# Patient Record
Sex: Male | Born: 1960 | Race: White | Hispanic: No | Marital: Married | Smoking: Former smoker
Health system: Southern US, Community
[De-identification: ages and names within clinical notes are randomized; demographics above are authoritative.]

## PROBLEM LIST (undated history)

## (undated) DIAGNOSIS — C7A8 Other malignant neuroendocrine tumors: Secondary | ICD-10-CM

## (undated) DIAGNOSIS — N289 Disorder of kidney and ureter, unspecified: Secondary | ICD-10-CM

## (undated) DIAGNOSIS — C7951 Secondary malignant neoplasm of bone: Principal | ICD-10-CM

## (undated) DIAGNOSIS — IMO0001 Reserved for inherently not codable concepts without codable children: Secondary | ICD-10-CM

## (undated) DIAGNOSIS — C719 Malignant neoplasm of brain, unspecified: Secondary | ICD-10-CM

## (undated) DIAGNOSIS — I2699 Other pulmonary embolism without acute cor pulmonale: Secondary | ICD-10-CM

## (undated) DIAGNOSIS — I519 Heart disease, unspecified: Secondary | ICD-10-CM

## (undated) DIAGNOSIS — C3490 Malignant neoplasm of unspecified part of unspecified bronchus or lung: Secondary | ICD-10-CM

## (undated) HISTORY — DX: Secondary malignant neoplasm of bone: C79.51

## (undated) HISTORY — DX: Other pulmonary embolism without acute cor pulmonale: I26.99

## (undated) HISTORY — PX: OTHER SURGICAL HISTORY: SHX169

## (undated) HISTORY — DX: Heart disease, unspecified: I51.9

## (undated) HISTORY — DX: Other malignant neuroendocrine tumors: C7A.8

## (undated) HISTORY — PX: LUNG BIOPSY: SHX232

## (undated) HISTORY — DX: Reserved for inherently not codable concepts without codable children: IMO0001

---

## 2001-01-16 ENCOUNTER — Emergency Department (HOSPITAL_COMMUNITY): Admission: EM | Admit: 2001-01-16 | Discharge: 2001-01-16 | Payer: Self-pay | Admitting: Emergency Medicine

## 2002-02-22 ENCOUNTER — Encounter: Payer: Self-pay | Admitting: Emergency Medicine

## 2002-02-22 ENCOUNTER — Emergency Department (HOSPITAL_COMMUNITY): Admission: EM | Admit: 2002-02-22 | Discharge: 2002-02-22 | Payer: Self-pay | Admitting: Emergency Medicine

## 2004-03-23 ENCOUNTER — Emergency Department (HOSPITAL_COMMUNITY): Admission: EM | Admit: 2004-03-23 | Discharge: 2004-03-23 | Payer: Self-pay | Admitting: Emergency Medicine

## 2006-03-01 ENCOUNTER — Emergency Department (HOSPITAL_COMMUNITY): Admission: EM | Admit: 2006-03-01 | Discharge: 2006-03-01 | Payer: Self-pay | Admitting: Emergency Medicine

## 2008-09-03 HISTORY — PX: KIDNEY SURGERY: SHX687

## 2009-06-09 ENCOUNTER — Inpatient Hospital Stay (HOSPITAL_COMMUNITY): Admission: RE | Admit: 2009-06-09 | Discharge: 2009-06-10 | Payer: Self-pay | Admitting: Family Medicine

## 2009-09-03 HISTORY — PX: CARDIAC CATHETERIZATION: SHX172

## 2010-01-20 ENCOUNTER — Observation Stay (HOSPITAL_COMMUNITY): Admission: AD | Admit: 2010-01-20 | Discharge: 2010-01-21 | Payer: Self-pay | Admitting: Cardiovascular Disease

## 2010-11-20 LAB — CBC
HCT: 42.8 % (ref 39.0–52.0)
Hemoglobin: 15.4 g/dL (ref 13.0–17.0)
MCV: 91.1 fL (ref 78.0–100.0)
Platelets: 200 10*3/uL (ref 150–400)
RBC: 4.7 MIL/uL (ref 4.22–5.81)
RDW: 13.9 % (ref 11.5–15.5)
RDW: 14.4 % (ref 11.5–15.5)

## 2010-11-20 LAB — DIFFERENTIAL
Eosinophils Relative: 2 % (ref 0–5)
Lymphocytes Relative: 23 % (ref 12–46)
Lymphs Abs: 1.8 10*3/uL (ref 0.7–4.0)
Monocytes Absolute: 0.3 10*3/uL (ref 0.1–1.0)
Monocytes Relative: 4 % (ref 3–12)
Neutrophils Relative %: 71 % (ref 43–77)

## 2010-11-20 LAB — BASIC METABOLIC PANEL
BUN: 13 mg/dL (ref 6–23)
BUN: 8 mg/dL (ref 6–23)
CO2: 22 mEq/L (ref 19–32)
Calcium: 8.7 mg/dL (ref 8.4–10.5)
Chloride: 107 mEq/L (ref 96–112)
Creatinine, Ser: 0.98 mg/dL (ref 0.4–1.5)
Creatinine, Ser: 0.79 mg/dL (ref 0.4–1.5)
GFR calc Af Amer: >60 mL/min (ref >60)
Glucose, Bld: 100 mg/dL — ABNORMAL HIGH (ref 70–99)
Glucose, Bld: 108 mg/dL — ABNORMAL HIGH (ref 70–99)
Potassium: 3.3 mEq/L — ABNORMAL LOW (ref 3.5–5.1)
Sodium: 137 mEq/L (ref 135–145)

## 2010-11-20 LAB — LIPID PANEL
Cholesterol: 171 mg/dL (ref 0–200)
HDL: 22 mg/dL — ABNORMAL LOW (ref >39)
LDL Cholesterol: 122 mg/dL — ABNORMAL HIGH (ref 0–99)
Total CHOL/HDL Ratio: 7.8 RATIO

## 2010-11-20 LAB — POCT CARDIAC MARKERS: Troponin i, poc: <0.05 ng/mL (ref 0.00–0.09)

## 2010-11-20 LAB — PROTIME-INR: Prothrombin Time: 14.1 seconds (ref 11.6–15.2)

## 2010-11-20 LAB — CARDIAC PANEL(CRET KIN+CKTOT+MB+TROPI)
Total CK: 64 U/L (ref 7–232)
Troponin I: <0.01 ng/mL (ref 0.00–0.06)

## 2010-11-20 LAB — TSH: TSH: 1.595 u[IU]/mL (ref 0.350–4.500)

## 2010-12-07 LAB — COMPREHENSIVE METABOLIC PANEL
ALT: 17 U/L (ref 0–53)
AST: 15 U/L (ref 0–37)
Albumin: 4 g/dL (ref 3.5–5.2)
BUN: 14 mg/dL (ref 6–23)
CO2: 28 mEq/L (ref 19–32)
Calcium: 9.7 mg/dL (ref 8.4–10.5)
Creatinine, Ser: 1.16 mg/dL (ref 0.4–1.5)
GFR calc Af Amer: >60 mL/min (ref >60)
Glucose, Bld: 92 mg/dL (ref 70–99)

## 2010-12-07 LAB — PROTIME-INR
INR: 1.1 (ref 0.00–1.49)
Prothrombin Time: 14.1 seconds (ref 11.6–15.2)

## 2010-12-07 LAB — URINE CULTURE: Colony Count: NO GROWTH

## 2010-12-07 LAB — DIFFERENTIAL
Basophils Absolute: 0 10*3/uL (ref 0.0–0.1)
Neutro Abs: 11.2 10*3/uL — ABNORMAL HIGH (ref 1.7–7.7)
Neutrophils Relative %: 81 % — ABNORMAL HIGH (ref 43–77)

## 2010-12-07 LAB — CBC
HCT: 44.8 % (ref 39.0–52.0)
Hemoglobin: 15.4 g/dL (ref 13.0–17.0)
RBC: 4.98 MIL/uL (ref 4.22–5.81)
WBC: 13.8 10*3/uL — ABNORMAL HIGH (ref 4.0–10.5)

## 2010-12-07 LAB — URINALYSIS, ROUTINE W REFLEX MICROSCOPIC
Bilirubin Urine: NEGATIVE
Leukocytes, UA: NEGATIVE
Protein, ur: 100 mg/dL — AB
Specific Gravity, Urine: 1.025 (ref 1.005–1.030)
Urobilinogen, UA: 0.2 mg/dL (ref 0.0–1.0)

## 2011-06-30 ENCOUNTER — Emergency Department (HOSPITAL_COMMUNITY)
Admission: EM | Admit: 2011-06-30 | Discharge: 2011-06-30 | Disposition: A | Discharge status: Other Acute Inpatient Hospital | Payer: 59 | Source: Home / Self Care | Attending: Emergency Medicine | Admitting: Emergency Medicine

## 2011-06-30 ENCOUNTER — Inpatient Hospital Stay (HOSPITAL_COMMUNITY): Payer: 59

## 2011-06-30 ENCOUNTER — Inpatient Hospital Stay (HOSPITAL_COMMUNITY)
Admission: AD | Admit: 2011-06-30 | Discharge: 2011-07-02 | DRG: 542 | Disposition: A | Discharge status: Home / Self Care | Payer: 59 | Source: Other Acute Inpatient Hospital | Attending: Internal Medicine | Admitting: Internal Medicine

## 2011-06-30 ENCOUNTER — Emergency Department (HOSPITAL_COMMUNITY): Payer: 59

## 2011-06-30 DIAGNOSIS — C787 Secondary malignant neoplasm of liver and intrahepatic bile duct: Secondary | ICD-10-CM | POA: Diagnosis present

## 2011-06-30 DIAGNOSIS — R51 Headache: Secondary | ICD-10-CM | POA: Insufficient documentation

## 2011-06-30 DIAGNOSIS — C7800 Secondary malignant neoplasm of unspecified lung: Secondary | ICD-10-CM | POA: Diagnosis present

## 2011-06-30 DIAGNOSIS — T380X5A Adverse effect of glucocorticoids and synthetic analogues, initial encounter: Secondary | ICD-10-CM | POA: Diagnosis not present

## 2011-06-30 DIAGNOSIS — C7951 Secondary malignant neoplasm of bone: Principal | ICD-10-CM | POA: Diagnosis present

## 2011-06-30 DIAGNOSIS — C7952 Secondary malignant neoplasm of bone marrow: Principal | ICD-10-CM | POA: Diagnosis present

## 2011-06-30 DIAGNOSIS — G936 Cerebral edema: Secondary | ICD-10-CM | POA: Diagnosis present

## 2011-06-30 DIAGNOSIS — H9209 Otalgia, unspecified ear: Secondary | ICD-10-CM | POA: Insufficient documentation

## 2011-06-30 DIAGNOSIS — C7931 Secondary malignant neoplasm of brain: Secondary | ICD-10-CM | POA: Diagnosis present

## 2011-06-30 DIAGNOSIS — C771 Secondary and unspecified malignant neoplasm of intrathoracic lymph nodes: Secondary | ICD-10-CM | POA: Diagnosis present

## 2011-06-30 DIAGNOSIS — C3430 Malignant neoplasm of lower lobe, unspecified bronchus or lung: Secondary | ICD-10-CM | POA: Diagnosis present

## 2011-06-30 DIAGNOSIS — F17200 Nicotine dependence, unspecified, uncomplicated: Secondary | ICD-10-CM | POA: Diagnosis present

## 2011-06-30 DIAGNOSIS — D72829 Elevated white blood cell count, unspecified: Secondary | ICD-10-CM | POA: Diagnosis not present

## 2011-06-30 DIAGNOSIS — C7970 Secondary malignant neoplasm of unspecified adrenal gland: Secondary | ICD-10-CM | POA: Diagnosis present

## 2011-06-30 DIAGNOSIS — C7949 Secondary malignant neoplasm of other parts of nervous system: Secondary | ICD-10-CM | POA: Diagnosis present

## 2011-06-30 DIAGNOSIS — E785 Hyperlipidemia, unspecified: Secondary | ICD-10-CM | POA: Diagnosis present

## 2011-06-30 HISTORY — DX: Disorder of kidney and ureter, unspecified: N28.9

## 2011-06-30 LAB — COMPREHENSIVE METABOLIC PANEL
ALT: 18 U/L (ref 0–53)
Alkaline Phosphatase: 109 U/L (ref 39–117)
CO2: 23 mEq/L (ref 19–32)
Chloride: 97 mEq/L (ref 96–112)
GFR calc Af Amer: >90 mL/min (ref >90)
Glucose, Bld: 254 mg/dL — ABNORMAL HIGH (ref 70–99)
Potassium: 3.8 mEq/L (ref 3.5–5.1)
Sodium: 134 mEq/L — ABNORMAL LOW (ref 135–145)
Total Bilirubin: 0.4 mg/dL (ref 0.3–1.2)
Total Protein: 7.7 g/dL (ref 6.0–8.3)

## 2011-06-30 LAB — CBC
HCT: 48.9 % (ref 39.0–52.0)
Hemoglobin: 16.7 g/dL (ref 13.0–17.0)
RBC: 5.47 MIL/uL (ref 4.22–5.81)
WBC: 8.1 10*3/uL (ref 4.0–10.5)

## 2011-06-30 LAB — DIFFERENTIAL
Basophils Relative: 0 % (ref 0–1)
Lymphocytes Relative: 20 % (ref 12–46)
Lymphs Abs: 1.6 10*3/uL (ref 0.7–4.0)
Monocytes Absolute: 0.4 10*3/uL (ref 0.1–1.0)
Monocytes Relative: 5 % (ref 3–12)
Neutro Abs: 6 10*3/uL (ref 1.7–7.7)
Neutrophils Relative %: 74 % (ref 43–77)

## 2011-06-30 LAB — POCT I-STAT, CHEM 8
Creatinine, Ser: 0.8 mg/dL (ref 0.50–1.35)
TCO2: 24 mmol/L (ref 0–100)

## 2011-06-30 MED ORDER — MORPHINE SULFATE 4 MG/ML IJ SOLN
4.0000 mg | Freq: Once | INTRAMUSCULAR | Status: AC
  Administered 2011-06-30: 4 mg via INTRAVENOUS
  Filled 2011-06-30: qty 1

## 2011-06-30 MED ORDER — DEXAMETHASONE SODIUM PHOSPHATE 4 MG/ML IJ SOLN
10.0000 mg | Freq: Once | INTRAMUSCULAR | Status: AC
  Administered 2011-06-30: 14:00:00 via INTRAVENOUS
  Filled 2011-06-30: qty 3

## 2011-06-30 MED ORDER — IOHEXOL 300 MG/ML  SOLN
75.0000 mL | Freq: Once | INTRAMUSCULAR | Status: AC | PRN
  Administered 2011-06-30: 75 mL via INTRAVENOUS

## 2011-06-30 NOTE — ED Notes (Signed)
Report called to [facility] and ETA of 15 minutes

## 2011-06-30 NOTE — ED Notes (Signed)
Pt states he has had a knot on the right side of his head for about three months. States the pain is worse today

## 2011-06-30 NOTE — ED Provider Notes (Signed)
Scribed for [name], MD, the patient was seen in room APA01/APA01. This chart was scribed by [employer]. The patient's care started at 10:49  CSN: [id number] Arrival date & time: 06/30/2011 10:22 AM   First MD Initiated Contact with Patient 06/30/11 1049      Chief Complaint  Patient presents with  . Headache   HPI [name] is a 50 y.o. male who presents to the Emergency Department complaining of Headache. States he has had a knot on the right side of his head for three months. Reports that knot is associated with pain today. Patient is a mild smoker, non drinker and does not use illicit drugs. Patient ranks pain at 8/10 on NPS. Pain is non radiating at the site of the knot. Worse with palpation. Also complains of right ear pain. Reports partial relief with ibuprofen treatment.  History reviewed. No pertinent past medical history.  History reviewed. No pertinent past surgical history.  History reviewed. No pertinent family history.  History  Substance Use Topics  . Smoking status: Not on file  . Smokeless tobacco: Not on file  . Alcohol Use: Yes      Review of Systems  Constitutional: Negative.   HENT: Positive for ear pain.   Respiratory: Negative.   Cardiovascular: Negative.   Gastrointestinal: Negative.   Musculoskeletal: Negative.   Skin: Negative.   Neurological: Negative.   Hematological: Negative.   Psychiatric/Behavioral: Negative.   All other systems reviewed and are negative.    Allergies  Review of patient's allergies indicates no known allergies.  Home Medications   Current Outpatient Rx  Name Route Sig Dispense Refill  . ASPIRIN 81 MG PO TABS Oral Take 81 mg by mouth daily.      . IBUPROFEN 200 MG PO TABS Oral Take 600 mg by mouth every 6 (six) hours as needed. For pain       BP 124/73  Pulse 79  Temp(Src) 97 F (36.1 C) (Oral)  Resp 18  Ht 6\' 4"  (1.93 m)  Wt 190 lb (86.183 kg)  BMI 23.13 kg/m2  SpO2 99%  Physical Exam    Nursing note and vitals reviewed. Constitutional: He is oriented to person, place, and time. He appears well-developed and well-nourished.       Awake, alert, nontoxic appearance with baseline speech for patient.  HENT:  Head: Atraumatic.  Right Ear: Tympanic membrane and external ear normal.  Left Ear: Tympanic membrane and external ear normal.  Mouth/Throat: No oropharyngeal exudate.       Swollen area approximately the size of golf ball to the right temporal region. Tender, not warm or red.  Eyes: EOM are normal. Right eye exhibits no discharge. Left eye exhibits no discharge.  Neck: Neck supple.  Cardiovascular: Normal rate and regular rhythm.   No murmur heard. Pulmonary/Chest: Effort normal and breath sounds normal. No stridor. No respiratory distress. He has no wheezes. He has no rales. He exhibits no tenderness.  Abdominal: Soft. Bowel sounds are normal. There is no tenderness.  Musculoskeletal: He exhibits no tenderness.       Baseline ROM, moves extremities with no obvious new focal weakness.  Lymphadenopathy:    He has no cervical adenopathy.  Neurological: He is alert and oriented to person, place, and time. No cranial nerve deficit.       Awake, alert, cooperative and aware of situation; no facial asymmetry; tongue midline; major cranial nerves appear intact; no pronator drift, normal gait.  Skin: Skin is warm  and dry. No rash noted. No erythema.  Psychiatric: He has a normal mood and affect. His behavior is normal.    ED Course  Procedures   Labs Reviewed  POCT I-STAT, CHEM 8 - Abnormal; Notable for the following:    Glucose, Bld 107 (*)    Hemoglobin 18.0 (*)    HCT 53.0 (*)    All other components within normal limits  I-STAT, CHEM 8   No results found.   No diagnosis found. Patient advised as to the CT scan result in need for transfer DIAGNOSTIC STUDIES: Oxygen Saturation is 99% on room air, normal by my interpretation.   1:35 pm headache improved after  morphine patient declines further pain medicine. Remains alert Glasgow Coma Score 15 COORDINATION OF CARE: 11:06 - EDP examined patient at bedside. CT head ordered to view knot.   Spoke with Dr.Stinson, who accepts patient in transfer MDM  In light of vasogenic edema, it was decided to transfer to patient to [facility]. Decadron ordered.  CRITICAL CARE Performed by: [name]   Total critical care time: 30 minutes  Critical care time was exclusive of separately billable procedures and treating other patients.  Critical care was necessary to treat or prevent imminent or life-threatening deterioration.  Critical care was time spent personally by me on the following activities: development of treatment plan with patient and/or surrogate as well as nursing, discussions with consultants, evaluation of patient's response to treatment, examination of patient, obtaining history from patient or surrogate, ordering and performing treatments and interventions, ordering and review of laboratory studies, ordering and review of radiographic studies, pulse oximetry and re-evaluation of patient's condition.   Scribe Attestation I personally performed the services described in this documentation, which was scribed in my presence. The recorded information has been reviewed and considered.    [name], MD 06/30/11 1339

## 2011-07-01 ENCOUNTER — Inpatient Hospital Stay (HOSPITAL_COMMUNITY): Payer: 59

## 2011-07-01 DIAGNOSIS — C3490 Malignant neoplasm of unspecified part of unspecified bronchus or lung: Secondary | ICD-10-CM

## 2011-07-01 DIAGNOSIS — C7951 Secondary malignant neoplasm of bone: Secondary | ICD-10-CM

## 2011-07-01 DIAGNOSIS — C7952 Secondary malignant neoplasm of bone marrow: Secondary | ICD-10-CM

## 2011-07-01 DIAGNOSIS — C787 Secondary malignant neoplasm of liver and intrahepatic bile duct: Secondary | ICD-10-CM

## 2011-07-01 LAB — PROTIME-INR
INR: 1 (ref 0.00–1.49)
Prothrombin Time: 13.4 seconds (ref 11.6–15.2)

## 2011-07-01 LAB — DIFFERENTIAL
Eosinophils Absolute: 0 10*3/uL (ref 0.0–0.7)
Lymphs Abs: 1.6 10*3/uL (ref 0.7–4.0)
Monocytes Absolute: 0.7 10*3/uL (ref 0.1–1.0)
Monocytes Relative: 4 % (ref 3–12)
Neutro Abs: 13.2 10*3/uL — ABNORMAL HIGH (ref 1.7–7.7)
Neutrophils Relative %: 85 % — ABNORMAL HIGH (ref 43–77)

## 2011-07-01 LAB — CBC
Hemoglobin: 16.3 g/dL (ref 13.0–17.0)
MCH: 30.9 pg (ref 26.0–34.0)
MCHC: 35.7 g/dL (ref 30.0–36.0)
MCV: 86.5 fL (ref 78.0–100.0)
RBC: 5.27 MIL/uL (ref 4.22–5.81)

## 2011-07-01 LAB — COMPREHENSIVE METABOLIC PANEL
CO2: 23 mEq/L (ref 19–32)
Calcium: 10.2 mg/dL (ref 8.4–10.5)
Creatinine, Ser: 0.9 mg/dL (ref 0.50–1.35)
GFR calc Af Amer: >90 mL/min (ref >90)
GFR calc non Af Amer: >90 mL/min (ref >90)
Glucose, Bld: 134 mg/dL — ABNORMAL HIGH (ref 70–99)

## 2011-07-01 MED ORDER — GADOBENATE DIMEGLUMINE 529 MG/ML IV SOLN
20.0000 mL | Freq: Once | INTRAVENOUS | Status: AC | PRN
  Administered 2011-07-01: 20 mL via INTRAVENOUS

## 2011-07-01 MED ORDER — IOHEXOL 300 MG/ML  SOLN
100.0000 mL | Freq: Once | INTRAMUSCULAR | Status: AC | PRN
  Administered 2011-07-01: 100 mL via INTRAVENOUS

## 2011-07-02 ENCOUNTER — Inpatient Hospital Stay (HOSPITAL_COMMUNITY): Payer: 59

## 2011-07-02 ENCOUNTER — Encounter (HOSPITAL_COMMUNITY): Payer: 59

## 2011-07-02 MED ORDER — TECHNETIUM TC 99M MEDRONATE IV KIT
25.0000 | PACK | Freq: Once | INTRAVENOUS | Status: AC | PRN
Start: 2011-07-02 — End: 2011-07-02
  Administered 2011-07-02: 25 via INTRAVENOUS

## 2011-07-03 LAB — PROTEIN ELECTROPH W RFLX QUANT IMMUNOGLOBULINS
M-Spike, %: NOT DETECTED g/dL
Total Protein ELP: 7.3 g/dL (ref 6.0–8.3)

## 2011-07-03 NOTE — Consult Note (Signed)
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  [name] is a 50 year old man, 2-pack per day cigarette smoker until about 1 year ago when he decreased to 1/2 pack per day.  He has been in overall excellent health with no major medical or surgical illness.  He noticed a lump in the right temporal area of his scalp about 3 months ago.  He did not pay much attention to it until about a week ago when he started to have significant pain and headache.  He reported to the emergency department with these complaints where a CT scan of the head was done and shows a huge 6 x 6 cm lytic lesion in the frontoparietal bone extending intracranial with marked surrounding edema and mass effect on the brain.  There is a 12-mm midline shift.  A second lesion seen in the right parietal region, 3 x 1.7 cm.  No parenchymal brain masses identified on that study.  CT scan of the chest, abdomen, and pelvis were done.  I have personally reviewed all of the radiographic images.  There is a dominant right lower lobe lung mass 4 x 3.6 cm extending towards the hilum, an additional 2 x 1.8 cm satellite lesion in the right lower lobe and a 0.8- cm nodule in the left upper lobe.  There is bulky right hilar adenopathy up to 4 x 3 cm, left hilar adenopathy 2 x 2 cm, right paratracheal adenopathy 2.5 x 1.9 cm, and multiple additional borderline enlarged mediastinal lymph nodes.  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  CT scan of the  abdomen shows multiple liver lesions, largest up to 2.6 cm.  There is a suspicious 2-cm right adrenal mass.  Other than the pain on the right side of his head, he reports no other new symptoms.  He has a chronic mild smoker's cough which has not worsened.  He denies any hemoptysis.  He has lost about 10 pounds in the last week and he was not trying to lose weight.  He is not having pain in any other area than the scalp.  PAST MEDICAL HISTORY:  He has nephrolithiasis and underwent a procedure at [facility] about 2 years ago.  No other surgeries.  He was evaluated in the past for chest pain and had a cardiac catheterizations on 01/21/2010 which did not show any coronary artery disease.  He has no history of hypertension, MI, diabetes, ulcers, hepatitis, yellow jaundice, thyroid trouble, seizure, or stroke.  He was on no chronic medications.  No allergies.  He does not have a primary care physician.  FAMILY HISTORY:  Father died of an MI at age 92.  Mother alive with hypertension  and diabetes.  He has 3 brothers and 2 sisters who are healthy.  He has 2 sons and 2 daughters.  A paternal grandfather had lung cancer.  A maternal grandfather had prostate cancer.  SOCIAL HISTORY:  He is a Music therapist.  He lives in [name].  Married, with 4 children, 2 girls and 2 boys, all healthy.  He is a former 2-pack per day cigarette smoker until just one year ago when he cut back.  ADDITIONAL REVIEW OF SYSTEMS:  No dyspnea.  No change in bowel habits. No urinary difficulties.  PHYSICAL EXAM:  GENERAL:  A well-nourished Caucasian man. VITAL SIGNS:  Blood pressure 118/64, pulse 63 and regular, respirations 18, temperature 97.7, and oxygen saturation 93%-94% on room air. SKIN, HAIR, AND NAILS:  Normal.  No ecchymosis, petechiae, or rash. NEUROLOGIC:  Pupils are equal, round, and reactive to light.  Optic disks sharp.  Vessels normal.  No hemorrhage or exudate.  No palpable edema.  Tongue  is midline.  Palate elevates symmetrically.  Full extraocular movements.  Motor strength 5/5.  Reflexes 1+, symmetric. Upper body coordination normal.  Gait not tested. HEAD AND NECK:  Oropharynx, no erythema or exudate.  No thyromegaly or thyroid mass. VASCULAR:  Carotids are 2+.  No bruits. CARDIAC:  Regular cardiac rhythm.  No murmur. EXTREMITIES:  With no cyanosis but there is clubbing of his fingertips. LUNGS:  Clear and resonant to percussion.  Diffuse decreased breath sounds. ABDOMEN:  Soft and nontender.  No mass, no organomegaly.  PERTINENT LABS:  On June 30, 2011, hemoglobin 16.7, hematocrit 48.9, MCV 89, white count 8100, 74 neutrophils, 20 lymphocytes, 5 monocytes, and platelet count 194,000.  Chemistry profile with sodium 134, random glucose 254, BUN 14, creatinine 0.8, bilirubin 0.4, alkaline phosphatase 109, SGOT 16, SGPT 18, albumin 3.9 with calcium 10.1.  LDH 289.  PSA was 0.2.  Baseline protime is 13.4 seconds, on no anticoagulation.  IMPRESSION:  Widely metastatic cancer, likely lung primary involving bone, lung, mediastinum, liver, and adrenal glands.  Widespread disease at presentation suggestive but not diagnostic of small cell histology.  He has a huge 6 x 6 cm lesion eroding through his right parietal bone with significant mass effect on his brain with midline shift and surrounding edema.  RECOMMENDATION:  I will discuss with Neurosurgery.  I think his long- term neurologic outcome would be improved with surgical resection of the dominant right frontotemporal metastasis.  This will also provide diagnostic tissue.  He will need postoperative cranial radiation and then a chemotherapy program based on histologic diagnosis.  I will order a bone scan to look for additional lesions.  I think a PET scan is not necessary given the widely metastatic presentation.  I would strongly consider antiseizure prophylaxis.  He has already been started on dexamethasone  to decrease cerebral edema.  Once a diagnosis is established, he can be followed in our [place] office at the [facility].  He lives only about 14 miles from [place] and radiation therapy could be given there closer to his home.  Thank you for this consultation.     Levert Feinstein, M.D., F.A.C.P.     JMG/MEDQ  D:  07/01/2011  T:  07/01/2011  Job:  [id number]  cc:   Jeoffrey Massed, MD Reinaldo Meeker, M.D. Radiation Oncology Ladona Horns. Mariel Sleet, MD  Electronically Signed by Cephas Darby M.D. on 07/03/2011 08:57:11 AM

## 2011-07-03 NOTE — H&P (Signed)
[name], [name] NO.:  [id number]  MEDICAL RECORD NO.:  [id number]  LOCATION:  3020                         FACILITY:  [facility]  PHYSICIAN:  Candelaria Celeste, DO      DATE OF BIRTH:  10/02/1960  DATE OF ADMISSION:  06/30/2011 DATE OF DISCHARGE:                             HISTORY & PHYSICAL   PRIMARY CARE PROVIDER:  None.  CHIEF COMPLAINT:  Cranial mass.  HISTORY OF PRESENT ILLNESS:  [name] is a 50 year old male who has a history of left renal calculi and dyslipidemia and tobacco abuse who presents was transferred from [facility] Emergency Department to [facility] for a right frontal temporal skull mass that started approximately 2 months ago.  It has been increasing in size over the past couple of months.  The patient noticed that it was getting more tender over the past couple of days and presented to the emergency department for evaluation.  The patient denies any neurological complaints.  He denies fevers chills, slurred speech, facial droop, weakness, headache, difficulty swallowing, chronic cough, difficulty breathing.  The patient does have mild vision blurring that is intermittent.  In the [facility] Emergency Department, the patient had a CT scan which showed a 6 cm x 6.5 cm x 4 cm heterogeneously enhancing lytic mass of the right frontal temporal skull that creates a 3 x 1 lytic lesion of the skull.  There is also a 12 mm midline shift with vasoedema surrounding the mass.  The patient was transferred to [facility] for further evaluation and neurosurgery consult.  PAST MEDICAL HISTORY:  Significant for dyslipidemia and tobacco abuse. It should be noted that the patient has not seen a regular family physician for many years.  MEDICATIONS:  None.  ALLERGIES:  No known medical allergies.  FAMILY HISTORY:  Significant for diabetes type 2, prostate cancer, and hypertension.  SOCIAL HISTORY:  The patient smokes a half a pack per week,  although has smoked quite a bit more in previous years.  He smoked since the age of 34.  He denies alcohol use.  REVIEW OF SYSTEMS:  The patient denies hematuria, urinary retention, nocturia, blood in his stool, abdominal pain.  Full 10 system review of system was performed which was as stated above or is otherwise negative.  PHYSICAL EXAMINATION:  VITAL SIGNS:  The patient is afebrile 97.7 with a heart rate of 56, blood pressure 109/76, and respiratory rate of 16, oxygen saturation 96% on room air. GENERAL:  This is a middle-aged Caucasian male who is awake, alert, and oriented x3 in no acute distress. HEENT:  There is a 4 cm soft bulge on the right frontal temporal region of the patient's skull.  Pupils are equal, round, and reactive to light. Extraocular muscles are intact.  Sclerae are anicteric.  Oropharynx shows teeth in disrepair but uvula that rises midline and tongue protrudes midline as well. NECK:  Supple without lymphadenopathy. LUNGS:  Clear to auscultation bilaterally.  No wheezes, rales, or rhonchi. CARDIAC:  Regular rate with normal S1 and S2 sounds.  No murmurs auscultated. ABDOMEN:  Soft, nontender, nondistended with no masses palpated. NEURO:  Cranial nerves II-XII  are grossly intact.  Sensation is intact to light touch.  Strength is 5/5 in upper and lower extremities bilaterally with no focal neurological deficits observed. VASCULAR:  Extremities are warm to touch with 2+ dorsalis pedis and radial pulses.  There is no edema present. SKIN:  There are no rashes, bruises or petechiae.  LABORATORY DATA: 1. A CBC was normal. 2. The differential is also normal. 3. Blood calcium was 9.7. 4. CT of the head as mentioned above showed a 6 x 6.4 x 4 cm     heterogeneously enhancing lytic mass lesions that is centered in     the right frontal temporal skull.  There is midline shift of 12 mm     at the foramen of Monro.  There is no edema surrounding the  vision.  IMPRESSION: 1. Cranial lytic lesion. 2. Nicotine addiction.  PLAN:  We will admit the patient to neurological floor and consult neurosurgery.  I did discuss with Dr. Gerlene Fee regarding the patient who will consult on the patient.  We will obtain an MRI with contrast to better delineate the intracranial process.  We will also obtain a CT of the chest, abdomen and pelvis to evaluate for either a primary cancer or other metastasis.  We will obtain a PSA and SPEP, UPEP, LDH and a CRP.  For the patient's nicotine addiction, we will give this patient a patch. The patient wished to be a full code and we will start Lovenox for DVT prophylaxis.          ______________________________ Candelaria Celeste, DO     JS/MEDQ  D:  06/30/2011  T:  06/30/2011  Job:  [id number]  Electronically Signed by Candelaria Celeste DO on 07/03/2011 03:12:43 PM

## 2011-07-04 NOTE — Discharge Summary (Signed)
[name], [name] NO.:  [id number]  MEDICAL RECORD NO.:  [id number]  LOCATION:  3020                         FACILITY:  [facility]  PHYSICIAN:  Jeoffrey Massed, MD    DATE OF BIRTH:  1961/06/11  DATE OF ADMISSION:  06/30/2011 DATE OF DISCHARGE:                        DISCHARGE SUMMARY - REFERRING   PRIMARY CARE PRACTITIONER:  Kirk Ruths, MD.  PRIMARY DISCHARGE DIAGNOSES: 1. Lung mass with liver, bone, and brain metastases highly suspicious     for primary bronchogenic carcinoma with widespread metastases. 2. Large destructive right lateral calvarial mass with intracranial     extension with associated vasogenic edema and midline shift.  SECONDARY DISCHARGE DIAGNOSES/PAST MEDICAL HISTORY: 1. Ongoing tobacco abuse. 2. Dyslipidemia.  DISCHARGE MEDICATIONS: 1. Decadron 4 mg 1 tablet 4 times a day. 2. Keppra 500 mg 1 tablet twice daily. 3. Nicotine patch 14 mg transdermally every 24 hours. 4. Zofran 4 mg 1 tablet every 6 hours p.r.n. for nausea and vomiting. 5. Protonix 40 mg 1 tablet p.o. daily. 6. Percocet 5/325 one to two tablets p.o. q.6 h p.r.n. for severe pain     and headache.  CONSULTANTS ON THE CASE: 1. Dr. Reinaldo Meeker from Neurosurgery. 2. Dr. Levert Feinstein from Oncology Service.  BRIEF HISTORY OF PRESENT ILLNESS:  The patient is a 50 year old man with a longstanding history of tobacco abuse, who apparently noticed the lump on the right temporal area on his scalp around 2 to 3 months ago.  He did not apparently pay much attention to it until a few days prior to admission when he started having pain and headaches.  He then presented to the emergency room at [facility], where he was found to have skull and brain masses with surrounding vasogenic edema.  He was then transferred over to [facility] for further evaluation and management.  For further details, please see the history and physical that was dictated by  Dr. Adrian Blackwater on admission.  PERTINENT RADIOLOGICAL STUDIES: 1. CT of the head done on June 30, 2011, showed right     frontotemporal lytic skull lesion with extensive intracranial soft     tissue extension as described.  Marked surrounding vasogenic edema     and mass effect with effacement of the right lateral ventricle and     12 mm midline shift.  The second lytic lesion with soft tissue     enhancement is noted in the right parietal skull. 2. CT scan of the abdomen and pelvis showed at least 7 liver     metastases, the largest in the dome measured above.  Metastatic     lymphadenopathy in the porta hepatis.  Nodule involving the right     adrenal gland. 3. CT of the chest with contrast showed approximate 4 cm mass     involving the medial aspect of the superior segment of the right     lower lobe, likely the primary tumor.  Satellite approximate 2 cm     nodule more peripherally in the right lower lobe.  Metastatic     nodules in the right middle lobe and left upper lobe.  Metastatic  lymphadenopathy in both hilar and mediastinum, with index measured     above.  Severe COPD/emphysema. 4. MRI of the brain with and without contrast showed large destructive     right lateral calvarial mass with intracranial extension measuring     55 x 58 x 64 mm.  Associated mass effect on the insula with     vasogenic edema and midline shift up to 10 mm.  Local invasion of     the brain is not excluded.  Similar smaller right hilar bone mass     with early intracranial extension is 33 x 13 x 38 mm.  C2 vertebral     body metastases without complicating features.  Small intracranial     intra-axial brain metastases in the anterior left frontal lobe     measuring 7 mm without mass effect or edema. 5. Nuclear medicine whole-body scan showed right calvarial osseous     metastases as demonstrated on prior examinations.  BRIEF HOSPITAL COURSE: 1. Lung mass with brain, bone, and liver metastases.   This is highly     suspicious for a primary bronchogenic malignancy in this long-time     smoker.  Given his brain mets with vasogenic edema, he was admitted     to the hospitalist service.  Neurosurgery was consulted.  That     consultation was provided by Dr. Aliene Beams.  The patient was     also seen in consult by Dr. Cephas Darby from the Oncology     Service.  The patient was started on Decadron to reduce the edema     and also started on Keppra for seizure prophylaxis.  Dr.     Cyndie Chime recommended surgical resection of the dominant right     frontotemporal metastases, as this would improve the patient's long-     term neurological outcome and also would provide [place] with diagnostic     tissue.  He did not recommend doing biopsy of other lesions in his     liver or the lung.  Dr. Gerlene Fee has seen this patient today and has     suggested that we convert his dexamethasone to p.o., and Dr.     Gerlene Fee also has cleared the patient for discharge.  Dr. Gerlene Fee     will schedule this patient this coming Friday for surgery, which     will probably involve some sort of craniotomy and excision of the     lytic skull lesion.  Family is aware, the patient is aware and     agreeable to be discharged.  They will be provided the number for     Dr. Trudee Grip office to try and coordinate the timing of this     resection on Friday.  In the meantime, the patient will continue     Decadron and Keppra.  I have also told the patient and the     patient's family in detail that if the patient were to have a     seizure, become altered, have continues persistent headache, he is     to present to the ED immediately for further evaluation. 2. Tobacco abuse.  The patient will be provided nicotine patches for     continued help with tobacco cessation.  He has been counseled     extensively by me regarding the need to completely stop tobacco     abuse given his diagnoses.  DISPOSITION:  The patient is  being discharged home with surgery and  subsequent biopsy to be scheduled to be done this Friday by Dr. Aliene Beams.  FOLLOWUP INSTRUCTIONS:  The patient to follow up with Dr. Trudee Grip office and contact them regarding further instructions about this proposed surgery this coming Friday.  Dr. Trudee Grip number will be left in the pink discharge sheet for the patient and the family to make follow up arrangements.  Total time spent for discharge 45 minutes.     Jeoffrey Massed, MD     SG/MEDQ  D:  07/02/2011  T:  07/02/2011  Job:  [id number]  cc:   Kirk Ruths, M.D. Levert Feinstein, M.D., F.A.C.P. Reinaldo Meeker, M.D.  Electronically Signed by Jeoffrey Massed  on 07/04/2011 12:36:53 PM

## 2011-07-05 HISTORY — PX: BRAIN SURGERY: SHX531

## 2011-07-05 HISTORY — PX: CRANIOTOMY FOR TUMOR: SUR345

## 2011-07-06 ENCOUNTER — Other Ambulatory Visit: Payer: Self-pay | Admitting: Neurosurgery

## 2011-07-06 ENCOUNTER — Inpatient Hospital Stay (HOSPITAL_COMMUNITY)
Admission: RE | Admit: 2011-07-06 | Discharge: 2011-07-09 | DRG: 026 | Disposition: A | Discharge status: Home / Self Care | Payer: 59 | Source: Ambulatory Visit | Attending: Neurosurgery | Admitting: Neurosurgery

## 2011-07-06 ENCOUNTER — Inpatient Hospital Stay (HOSPITAL_COMMUNITY): Payer: 59

## 2011-07-06 DIAGNOSIS — F411 Generalized anxiety disorder: Secondary | ICD-10-CM | POA: Diagnosis present

## 2011-07-06 DIAGNOSIS — C7951 Secondary malignant neoplasm of bone: Secondary | ICD-10-CM | POA: Diagnosis present

## 2011-07-06 DIAGNOSIS — Z9889 Other specified postprocedural states: Secondary | ICD-10-CM

## 2011-07-06 DIAGNOSIS — C700 Malignant neoplasm of cerebral meninges: Principal | ICD-10-CM | POA: Diagnosis present

## 2011-07-06 DIAGNOSIS — Z01810 Encounter for preprocedural cardiovascular examination: Secondary | ICD-10-CM

## 2011-07-06 DIAGNOSIS — Z01818 Encounter for other preprocedural examination: Secondary | ICD-10-CM

## 2011-07-06 DIAGNOSIS — F17200 Nicotine dependence, unspecified, uncomplicated: Secondary | ICD-10-CM | POA: Diagnosis present

## 2011-07-06 LAB — CBC
HCT: 42.6 % (ref 39.0–52.0)
MCH: 31.4 pg (ref 26.0–34.0)
MCHC: 36 g/dL (ref 30.0–36.0)
MCHC: 33.1 g/dL (ref 30.0–36.0)
MCV: 88.4 fL (ref 78.0–100.0)
Platelets: 246 10*3/uL (ref 150–400)
RDW: 13.4 % (ref 11.5–15.5)
WBC: 9.2 10*3/uL (ref 4.0–10.5)

## 2011-07-06 LAB — DIFFERENTIAL
Eosinophils Relative: 0 % (ref 0–5)
Lymphocytes Relative: 10 % — ABNORMAL LOW (ref 12–46)
Lymphs Abs: 0.9 10*3/uL (ref 0.7–4.0)
Monocytes Absolute: 0.3 10*3/uL (ref 0.1–1.0)

## 2011-07-06 LAB — BASIC METABOLIC PANEL
BUN: 24 mg/dL — ABNORMAL HIGH (ref 6–23)
BUN: 22 mg/dL (ref 6–23)
Calcium: 10 mg/dL (ref 8.4–10.5)
Chloride: 103 mEq/L (ref 96–112)
Creatinine, Ser: 0.88 mg/dL (ref 0.50–1.35)
GFR calc Af Amer: >90 mL/min (ref >90)
GFR calc non Af Amer: >90 mL/min (ref >90)
Glucose, Bld: 100 mg/dL — ABNORMAL HIGH (ref 70–99)
Glucose, Bld: 106 mg/dL — ABNORMAL HIGH (ref 70–99)

## 2011-07-06 LAB — SURGICAL PCR SCREEN: MRSA, PCR: NEGATIVE

## 2011-07-06 MED ORDER — ACETAMINOPHEN 650 MG RE SUPP: 650.0000 mg | RECTAL | Status: DC | PRN

## 2011-07-06 MED ORDER — VANCOMYCIN HCL IN DEXTROSE 1-5 GM/200ML-% IV SOLN
1000.0000 mg | Freq: Two times a day (BID) | INTRAVENOUS | Status: AC
  Administered 2011-07-08 (×2): 1000 mg via INTRAVENOUS
  Filled 2011-07-06 (×6): qty 200

## 2011-07-06 MED ORDER — HYDROMORPHONE HCL PF 2 MG/ML IJ SOLN: 1.5000 mg | INTRAMUSCULAR | Status: DC | PRN

## 2011-07-06 MED ORDER — MENTHOL 3 MG MT LOZG: 1.0000 | LOZENGE | OROMUCOSAL | Status: DC | PRN

## 2011-07-06 MED ORDER — INFLUENZA VIRUS VACC SPLIT PF IM SUSP: 0.5000 mL | Freq: Once | INTRAMUSCULAR | Status: DC

## 2011-07-06 MED ORDER — HYDROCODONE-ACETAMINOPHEN 5-325 MG PO TABS
1.0000 | ORAL_TABLET | ORAL | Status: DC | PRN
  Administered 2011-07-08: 2 via ORAL
  Filled 2011-07-06: qty 2

## 2011-07-06 MED ORDER — DEXTROSE-NACL 5-0.45 % IV SOLN: INTRAVENOUS | Status: DC

## 2011-07-06 MED ORDER — DEXAMETHASONE 6 MG PO TABS
6.0000 mg | ORAL_TABLET | Freq: Every day | ORAL | Status: DC
  Filled 2011-07-06 (×16): qty 1

## 2011-07-06 MED ORDER — SODIUM CHLORIDE 0.9 % IJ SOLN
3.0000 mL | Freq: Two times a day (BID) | INTRAMUSCULAR | Status: DC
  Administered 2011-07-08 – 2011-07-09 (×2): 3 mL via INTRAVENOUS
  Filled 2011-07-06 (×5): qty 3

## 2011-07-06 MED ORDER — NICOTINE 14 MG/24HR TD PT24
14.0000 mg | MEDICATED_PATCH | Freq: Every day | TRANSDERMAL | Status: DC
  Administered 2011-07-08 – 2011-07-09 (×2): 14 mg via TRANSDERMAL
  Filled 2011-07-06 (×4): qty 1

## 2011-07-06 MED ORDER — PROMETHAZINE HCL 25 MG/ML IJ SOLN
25.0000 mg | INTRAMUSCULAR | Status: DC | PRN
  Filled 2011-07-06: qty 1

## 2011-07-06 MED ORDER — DEXAMETHASONE SODIUM PHOSPHATE 10 MG/ML IJ SOLN: 6.0000 mg | INTRAMUSCULAR | Status: DC

## 2011-07-06 MED ORDER — PANTOPRAZOLE SODIUM 40 MG PO TBEC
40.0000 mg | DELAYED_RELEASE_TABLET | Freq: Every day | ORAL | Status: DC
  Administered 2011-07-08 – 2011-07-09 (×2): 40 mg via ORAL
  Filled 2011-07-06: qty 1

## 2011-07-06 MED ORDER — ACETAMINOPHEN 325 MG PO TABS: 650.0000 mg | ORAL_TABLET | Freq: Four times a day (QID) | ORAL | Status: DC | PRN

## 2011-07-06 MED ORDER — POTASSIUM CHLORIDE 2 MEQ/ML IV SOLN
INTRAVENOUS | Status: DC
  Filled 2011-07-06 (×4): qty 1000

## 2011-07-06 MED ORDER — LEVETIRACETAM 500 MG PO TABS
500.0000 mg | ORAL_TABLET | Freq: Two times a day (BID) | ORAL | Status: DC
  Administered 2011-07-08 – 2011-07-09 (×3): 500 mg via ORAL
  Filled 2011-07-06 (×7): qty 1

## 2011-07-06 MED ORDER — PHENOL 1.4 % MT LIQD: 1.0000 | OROMUCOSAL | Status: DC | PRN

## 2011-07-07 LAB — BASIC METABOLIC PANEL
CO2: 25 mEq/L (ref 19–32)
Chloride: 101 mEq/L (ref 96–112)
Potassium: 4.3 mEq/L (ref 3.5–5.1)
Sodium: 134 mEq/L — ABNORMAL LOW (ref 135–145)

## 2011-07-07 LAB — CBC
MCV: 88.2 fL (ref 78.0–100.0)
Platelets: 220 10*3/uL (ref 150–400)
RBC: 4.83 MIL/uL (ref 4.22–5.81)
WBC: 14.2 10*3/uL — ABNORMAL HIGH (ref 4.0–10.5)

## 2011-07-07 MED ORDER — DEXAMETHASONE 4 MG PO TABS: 4.0000 mg | ORAL_TABLET | Freq: Four times a day (QID) | ORAL | Status: DC

## 2011-07-07 MED ORDER — DEXAMETHASONE SODIUM PHOSPHATE 10 MG/ML IJ SOLN: 6.0000 mg | Freq: Four times a day (QID) | INTRAMUSCULAR | Status: DC

## 2011-07-07 MED ORDER — DEXAMETHASONE SODIUM PHOSPHATE 10 MG/ML IJ SOLN: 6.0000 mg | Freq: Once | INTRAMUSCULAR | Status: DC

## 2011-07-07 MED ORDER — DEXAMETHASONE SODIUM PHOSPHATE 4 MG/ML IJ SOLN
4.0000 mg | Freq: Four times a day (QID) | INTRAMUSCULAR | Status: DC
  Administered 2011-07-08 (×2): 4 mg via INTRAVENOUS
  Filled 2011-07-07 (×3): qty 1

## 2011-07-07 MED ORDER — DEXAMETHASONE SODIUM PHOSPHATE 4 MG/ML IJ SOLN: 4.0000 mg | Freq: Four times a day (QID) | INTRAMUSCULAR | Status: DC

## 2011-07-07 MED ORDER — DEXAMETHASONE SODIUM PHOSPHATE 4 MG/ML IJ SOLN
4.0000 mg | Freq: Four times a day (QID) | INTRAMUSCULAR | Status: DC | PRN
  Filled 2011-07-07 (×2): qty 1

## 2011-07-08 NOTE — Plan of Care (Signed)
Problem: Phase II Progression Outcomes Goal: Extubated and maintains O2 sats > 92% Outcome: Completed/Met Date Met:  07/08/11 Patient maintaining oxygen saturations greater than 92%.

## 2011-07-08 NOTE — Plan of Care (Signed)
Problem: Phase II Progression Outcomes Goal: Pain controlled Outcome: Progressing Patient requesting one pain pill for this shift and getting adequate relief per low pain rating. Goal: Progress activity as tolerated unless otherwise ordered Outcome: Progressing Patient able to ambulate with one stand by assist. Goal: Tolerating diet Outcome: Completed/Met Date Met:  07/08/11 Patient tolerating regular diet without any c/o of nausea or vomiting.

## 2011-07-08 NOTE — Progress Notes (Signed)
Subjective: Patient reports he is doing well. Not requiring pain meds. Mobilizing slowly.  Objective: Vital signs in last 24 hours: Temp:  [97.4 F (36.3 C)-97.9 F (36.6 C)] 97.4 F (36.3 C) (11/04 0800) Pulse Rate:  [46-62] 47  (11/04 0700) Resp:  [10-19] 10  (11/04 0800) BP: (99-115)/(56-84) 102/63 mmHg (11/04 0800) SpO2:  [91 %-95 %] 92 % (11/04 0800)  Intake/Output from previous day: 11/03 0701 - 11/04 0700 In: 2560 [P.O.:240; I.V.:1920; IV Piggyback:400] Out: 4098 [JXBJY:7829; Drains:10] Intake/Output this shift: Total I/O In: -  Out: 5 [Drains:5]  Neurologic: Alert and oriented X 3, normal strength and tone. No drift.   Lab Results:  Basename 07/07/11 0456 07/06/11 1145  WBC 14.2* 9.2  HGB 14.8 14.1  HCT 42.6 42.6  PLT 220 199   BMET  Basename 07/07/11 0456 07/06/11 1145  NA 134* 135  K 4.3 4.2  CL 101 103  CO2 25 20  GLUCOSE 121* 106*  BUN 19 22  CREATININE 0.70 0.88  CALCIUM 9.1 8.4    Studies/Results: No results found.  Assessment/Plan:  Remove drain. Heplock IV. Transfer to 3000. Will need Rad Onc and Oncology consults.   LOS: 2 days       Marcellene Shivley D 07/08/2011, 11:31 AM

## 2011-07-08 NOTE — Plan of Care (Signed)
Problem: Phase I Progression Outcomes Goal: Respiratory status stable Outcome: Progressing Respirations are even and unlabored however, bases remain slightly diminished. No s/s of respiratory distress noted.    Goal: Voiding-avoid urinary catheter unless indicated Outcome: Completed/Met Date Met:  07/08/11 Able to void per urinal or bathroom.

## 2011-07-09 MED ORDER — DEXAMETHASONE 4 MG PO TABS
4.0000 mg | ORAL_TABLET | Freq: Four times a day (QID) | ORAL | Status: DC
  Administered 2011-07-09 (×2): 4 mg via ORAL
  Filled 2011-07-09 (×6): qty 1

## 2011-07-09 MED ORDER — DEXAMETHASONE 4 MG PO TABS: 2.0000 mg | ORAL_TABLET | Freq: Three times a day (TID) | ORAL | Status: DC

## 2011-07-09 NOTE — Progress Notes (Signed)
  Subjective: Patient reports Doing great. No complaints  Objective: Vital signs in last 24 hours: Temp:  [97.6 F (36.4 C)-98.2 F (36.8 C)] 98 F (36.7 C) (11/05 0656) Pulse Rate:  [50-56] 51  (11/05 0656) Resp:  [17-18] 17  (11/05 0656) BP: (108-124)/(65-72) 117/69 mmHg (11/05 0656) SpO2:  [93 %-96 %] 94 % (11/05 0656) Weight:  [87.907 kg (193 lb 12.8 oz)] 193 lb 12.8 oz (87.907 kg) (11/04 1547)  Intake/Output from previous day: 11/04 0701 - 11/05 0700 In: 240 [P.O.:240] Out: 1736 [Urine:1725; Drains:11] Intake/Output this shift:    Neurologic: Grossly normal  Lab Results:  Basename 07/07/11 0456 07/06/11 1145  WBC 14.2* 9.2  HGB 14.8 14.1  HCT 42.6 42.6  PLT 220 199   BMET  Basename 07/07/11 0456 07/06/11 1145  NA 134* 135  K 4.3 4.2  CL 101 103  CO2 25 20  GLUCOSE 121* 106*  BUN 19 22  CREATININE 0.70 0.88  CALCIUM 9.1 8.4    Studies/Results: No results found.  Assessment/Plan: Doing great. Wound fine. Home today. Discharge instructions given!  LOS: 3 days  Discharge patient.   Reinaldo Meeker, MD 07/09/2011, 8:08 AM

## 2011-07-09 NOTE — Progress Notes (Signed)
Pt's assessment unchanged from AM. Pt and family given D/C instructions. Pt D/C'd home via wheelchair with family @ 1120 per MD order.

## 2011-07-10 LAB — TYPE AND SCREEN

## 2011-07-16 LAB — POCT I-STAT 7, (LYTES, BLD GAS, ICA,H+H)
Bicarbonate: 26.4 mEq/L — ABNORMAL HIGH (ref 20.0–24.0)
Hemoglobin: 15.3 g/dL (ref 13.0–17.0)
O2 Saturation: 100 %
Patient temperature: 35.3
Potassium: 4.1 mEq/L (ref 3.5–5.1)
Sodium: 134 mEq/L — ABNORMAL LOW (ref 135–145)
TCO2: 28 mmol/L (ref 0–100)
pH, Arterial: 7.366 (ref 7.350–7.450)

## 2011-07-17 ENCOUNTER — Encounter (HOSPITAL_COMMUNITY): Payer: Self-pay | Admitting: Oncology

## 2011-07-17 ENCOUNTER — Encounter (HOSPITAL_COMMUNITY): Payer: 59 | Attending: Oncology | Admitting: Oncology

## 2011-07-17 VITALS — BP 139/75 | HR 63 | Temp 98.0°F | Ht 76.0 in | Wt 202.0 lb

## 2011-07-17 DIAGNOSIS — G4700 Insomnia, unspecified: Secondary | ICD-10-CM | POA: Insufficient documentation

## 2011-07-17 DIAGNOSIS — C787 Secondary malignant neoplasm of liver and intrahepatic bile duct: Secondary | ICD-10-CM

## 2011-07-17 DIAGNOSIS — C7A1 Malignant poorly differentiated neuroendocrine tumors: Secondary | ICD-10-CM

## 2011-07-17 DIAGNOSIS — G939 Disorder of brain, unspecified: Secondary | ICD-10-CM

## 2011-07-17 DIAGNOSIS — C7931 Secondary malignant neoplasm of brain: Secondary | ICD-10-CM | POA: Insufficient documentation

## 2011-07-17 DIAGNOSIS — C7952 Secondary malignant neoplasm of bone marrow: Secondary | ICD-10-CM | POA: Insufficient documentation

## 2011-07-17 DIAGNOSIS — C7A090 Malignant carcinoid tumor of the bronchus and lung: Secondary | ICD-10-CM | POA: Insufficient documentation

## 2011-07-17 DIAGNOSIS — C3490 Malignant neoplasm of unspecified part of unspecified bronchus or lung: Secondary | ICD-10-CM

## 2011-07-17 DIAGNOSIS — C7951 Secondary malignant neoplasm of bone: Secondary | ICD-10-CM | POA: Insufficient documentation

## 2011-07-17 MED ORDER — TEMAZEPAM 15 MG PO CAPS: ORAL_CAPSULE | ORAL | Status: AC

## 2011-07-17 NOTE — Progress Notes (Signed)
[facility] NEW PATIENT EVALUATION   Name: [name] Conner Date: 07/17/2011 MRN: [id number] DOB: 04/11/1961    CC:  [name] Ruths, MD   DIAGNOSIS: The primary encounter diagnosis was Small cell lung cancer. A diagnosis of Insomnia was also pertinent to this visit.   HISTORY OF PRESENT ILLNESS:[name] Conner is a 50 y.o. 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   The patient reports that on one Saturday morning he woke up with a nausea and vomiting. As result he presented to the [facility] emergency department where if you radiographic studies were performed. A CT scan of the brain was performed which revealed lesions with a 12 mm midline shift. As result he was transferred to [facility] for further evaluation and treatment.  The patient reports that he now feels well. He explains that he feels as good as he felt prior to his admission in the hospital.  He was seen by Dr. Cyndie Chime while being [facility] Dr. Cyndie Chime notes that the CT scan of the head completed in the emergency room revealed a large 6 cm x 6 cm lytic lesion in the frontoparietal bone extending intracranially with the market surrounding edema and mass effect on the brain. As result there is a 12 mm midline shift. He second lesion is noted in the right parietal region, 3 cm x 1.7 cm. No parenchymal brain mass is identified.  Further radiographic studies reveals a dominant right lower lobe mass measuring 4 x 3.6 cm extending towards the hilum. There is also an additional 2 x1.8 centimeter satellite lesion in the right lower lobe, and a 0.8 cm nodule in the left upper lobe. There is bulky right hilar adenopathy up to 4 x 3 cm in size, left hilar adenopathy 2 x 2 cm, right paratracheal adenopathy to 0.5 x 1.9 cm, and multiple additional borderline enlarged mediastinal lymph nodes. CT scan of chest also reveals  emphysema of the lungs. CT scan of abdomen reveals multiple liver lesions, at least 7. The largest measures 2.6 cm in size. There is also a 2 cm right adrenal mass that is suspicious for metastatic disease.  The patient underwent surgical resection of the large right frontotemporal lesion, by Dr. Gerlene Fee.  He remains on prophylactic antiseizure medication and dexamethasone. Patient reports he is due to see Dr. Gerlene Fee tomorrow for evaluation of his healing surgical wound.   The patient denies any headache, dizziness, double vision, fevers, chills, night sweats, nausea, vomiting, diarrhea, constipation, chest pain, heart palpitations, shortness of breath, abdominal pain, blood in stool, black tarry stool, hematuria, urinary pain, urinary burning.  He does report a 10 pound weight loss over one year.  The patient is seen today with his wife [name] Conner.  We a significantly long conversation about the patient's workup. Unfortunately the patient tells me that no one went over radiographic study reports and pathology.  As result I spent significant time and patient going over these results.  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 He understands that due to the fact that this is a neuroendocrine cancer, it is treated as if he has small cell lung cancer. He also knows that due to the fact that he has a poorly differentiated cancer, it is more aggressive and confers a poorer prognosis.  Specimen time patient going over the staging of his cancer. He understands his  stage IV disease and is therefore incurable. Despite that fact, he continues to desire treatment.  Specimen time generally discussing chemotherapy. He understands the chemotherapy well a fact fast growing cell lines including hair growth, gastrointestinal tract, and blood counts. He understands that his blood products are manufactured in the bone marrow. Chemotherapy may affect his bone marrow  and therefore a fact his counts. Patient understands the chemotherapy that we'll likely be chosen for his cancer will be delivered over 4-5 days. The patient has not been seen by radiation oncology. We will set up that consultation. In the meantime however, it is important to have a Port-A-Cath placed by Gen. surgery and embark on chemotherapy. I spent some time with the patient going over the reason for a Port-A-Cath. He understands this can be now and prior to needle sticks.  The patient questions whether he can begin driving. I've asked him to refrain from driving. I've also asked the patient to bring this topic up to Dr.Kritzer.  The patient also asks when he go back to work. I've asked him to avoid going back to work at this point in time. He likely would not want to work while being treated. He understands this. It is important for the patient to entertain the idea of disability and/or short-term disability. 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  Patient reports that his son was in a car accident earlier this year and now suffers from permanent brain damage. As result the patient and his wife are caring for their son full-time.   FAMILY HISTORY: family history is not on file. the patient has 3 sisters and 3 brothers. He has 2 biological sons who are 56 and 45 year old hold respectively. He also has 2 stepdaughters. The patient's father passed weight the age of 18 due to myocardial infarction. The patient's mother still alive with hypertension diabetes. Patient has a paternal grandfather who passed away from lung cancer. He also has a maternal grandfather who had prostate cancer.   PAST MEDICAL HISTORY:  has a past medical history of Renal disorder.  patient is status post procedure at [facility] for nephrolithiasis approximately 2 years ago. Patient was evaluated for chest pain in the past and had a cardiac catheterization on 01/21/2010  which did not show any coronary artery disease.     CURRENT MEDICATIONS: See medication list for complete list. 1. Decadron 4 mg 3 times daily 2. Keppra 500 mg every 12 hours 3. Zofran 4 mg every 6 hours as needed for nausea and vomiting 4. Oxycodone/APAP 5/325 mg 1-2 tablets every 6 hours as needed for pain.   SOCIAL HISTORY:  reports that he has been smoking Cigarettes.  He does not have any smokeless tobacco history on file. He reports that he does not drink alcohol or use illicit drugs. the patient was born in [place] but presently resides in [place]. He went to school and completed 11 grade. 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 He is presently the only employee at the company. The patient is presently remarried. He's been married for 13 years. His previous marriage last 17 years. He lives with his wife at home. He denies any alcohol or illicit drug use. He does admit to a 2 pack per day smoking history for approximately 34 years. Approximately one year prior  to diagnosis, he decrease his tobacco usage to a half a pack per day because he began utilizing electronic cigarettes.    ALLERGIES: Review of patient's allergies indicates no known allergies.   LABORATORY DATA:  CBC    Component Value Date/Time   WBC 14.2* 07/07/2011 0456   RBC 4.83 07/07/2011 0456   HGB 14.8 07/07/2011 0456   HCT 42.6 07/07/2011 0456   PLT 220 07/07/2011 0456   MCV 88.2 07/07/2011 0456   MCH 30.6 07/07/2011 0456   MCHC 34.7 07/07/2011 0456   RDW 13.3 07/07/2011 0456   LYMPHSABS 0.9 07/06/2011 1145   MONOABS 0.3 07/06/2011 1145   EOSABS 0.0 07/06/2011 1145   BASOSABS 0.0 07/06/2011 1145      Chemistry      Component Value Date/Time   NA 134* 07/07/2011 0456   K 4.3 07/07/2011 0456   CL 101 07/07/2011 0456   CO2 25 07/07/2011 0456   BUN 19 07/07/2011 0456   CREATININE  0.70 07/07/2011 0456      Component Value Date/Time   CALCIUM 9.1 07/07/2011 0456   ALKPHOS 109 07/01/2011 0635   AST 15 07/01/2011 0635   ALT 16 07/01/2011 0635   BILITOT 0.3 07/01/2011 0635       RADIOGRAPHY:  07/02/2011  *RADIOLOGY REPORT*  Clinical Data: Calvarial lesions and lung mass; evaluate for  metastatic disease.  NUCLEAR MEDICINE WHOLE BODY BONE SCINTIGRAPHY  Technique: Whole body anterior and posterior images were obtained  approximately 3 hours after intravenous injection of  radiopharmaceutical.  Radiopharmaceutical: 25 mCi technetium 47m MDP intravenously.  Comparison: MRI brain 07/01/2011. CTs of the head, chest, abdomen  and pelvis 06/30/2011.  Findings: There is peripheral increased activity associated with  the known destructive lesions involving the right temporal and  posterior parietal bones. No other osseous metastases are  identified. There is prominent activity within the left renal  pelvis corresponding with UPJ stenosis on CT. The soft tissue  activity otherwise appears normal.  IMPRESSION:  1. Right calvarial osseous metastases as demonstrated on prior  examinations.  2. No other osseous metastases identified.  3. Left UPJ stenosis.  Original Report Authenticated By: Gerrianne Scale, M.D.  07/01/11  *RADIOLOGY REPORT*  Clinical Data: 50 year old male with destructive calvarial lesions  and lung mass. Pain.  MRI HEAD WITHOUT AND WITH CONTRAST  Technique: Multiplanar, multiecho pulse sequences of the brain and  surrounding structures were obtained according to standard protocol  without and with intravenous contrast  Contrast: 20mL MULTIHANCE GADOBENATE DIMEGLUMINE 529 MG/ML IV SOLN  Comparison: Head CT 06/30/2011.  Findings: Call form enhancing soft tissue mass traversing and  eroding the right lateral skull at the level of the lateral  ventricles severely compresses the right insula and operculum with  moderate effacement of the right  lateral ventricle and up to 10 mm  of leftward midline shift. Including the associated dural  thickening, the mass encompasses 58 x 55 x 64 mm (AP by transverse  by CC). There is associated vasogenic edema in the right  hemisphere. Direct invasion of brain parenchyma cannot be  excluded.  There is a second similar lesion which originated in the skull and  now extends intracranially a short distance along the right  occipital lobe measuring 13 x 33 x 38 mm (transverse by AP by CC).  Mild mass effect at this site without vasogenic edema.  There is a C2 vertebral body bone metastasis measuring 16 mm.  There is a subtle intra-axial metastasis measuring  7 mm in diameter  in the anterior left inferior frontal gyrus (series 11 image 30,  series 5 image 13). There is a questionable small dural-based mass  along the anterior interhemispheric fissure nearby on the right  measuring 6 mm (series 11 image 28).  No restricted diffusion to suggest acute infarction. No  ventriculomegaly. There are some component of hemorrhage within the  large bone lesions described earlier. Otherwise no acute  intracranial hemorrhage. Major intracranial vascular flow voids are  preserved with dominant left vertebral artery. Negative pituitary,  brainstem, cerebellum, and visualized spinal cord. . Visualized  orbit soft tissues are within normal limits. Visualized paranasal  sinuses and mastoids are clear.  IMPRESSION:  1. Large destructive right lateral calvarial mass with  intracranial extension measures 55 x 58 x 64 mm. Associated mass  effect on the insula with vasogenic edema and midline shift up to  10 mm. Local invasion of the brain not excluded.  2. Smaller similar right parietal bone mass with early  intracranial extension is 33 x 13 x 38 mm. C2 vertebral body  metastasis without complicating features.  3. Small intra-axial brain metastasis in the anterior left frontal  lobe measuring 7 mm without mass  effect or edema.  4. Questionable small anterior interhemispheric dural lesion  measuring 6 mm on series 11 image 28.  Original Report Authenticated By: Harley Hallmark, M.D.  07/01/11  *RADIOLOGY REPORT*  Clinical Data: Mass involving the right temporal bone with  intracranial extension. Evaluate for primary tumor or other  metastatic disease.  CT CHEST, ABDOMEN AND PELVIS WITH CONTRAST 06/30/2011:  Technique: Multidetector CT imaging of the chest, abdomen and  pelvis was performed following the standard protocol during bolus  administration of intravenous contrast.  Contrast: 100 ml Omnipaque-300 IV. Oral contrast was also  administered.  Comparison: None.  CT CHEST  Findings: Mass involving the medial superior segment right lower  lobe measuring approximately 4.1 x 3.6 cm (series 3, image 36),  extending to the right hilum. Satellite nodule more peripherally  in the right lower lobe measuring approximately 2.0 x 1.8 cm (same  image). Approximate 0.7 cm nodule in the anterior right middle  lobe (image 45). Vague approximate 0.8 cm nodule in the left upper  lobe (image 31). Severe emphysematous changes throughout both  lungs with fibrosis in the lower lobes, right greater than left.  Right hilar lymphadenopathy measuring approximately 4.1 x 2.8 cm  (series 2, image 32). Contralateral left hilar lymphadenopathy  measuring approximately 1.8 x 2.1 cm (image 29). Enlarged right  paratracheal (station 4R) node measuring approximately 1.8 x 2.3 cm  (image 24). Enlarged superior right paratracheal node (station 2R)  measuring approximate 2.5 x 1.9 cm. Multiple other mildly enlarged  mediastinal lymph nodes. No axillary lymphadenopathy. Visualized  thyroid gland unremarkable.  Heart size normal. No pericardial effusion. No visible coronary  artery calcification. No visible atherosclerosis involving the  thoracic aorta or the proximal great vessels. Bone window images  demonstrate lower  thoracic spondylosis but no definite osseous  metastatic disease.  IMPRESSION:  1. Approximate 4 cm mass involving the medial aspect of the  superior segment of the right lower lobe, likely the primary tumor.  2. Satellite approximate 2 cm nodule more peripherally in the  right lower lobe.  3. Metastatic nodules in the right middle lobe and left upper  lobe.  4. Metastatic lymphadenopathy in both hila and the mediastinum,  with index nodes measured above.  5. Severe COPD/emphysema.  CT ABDOMEN AND PELVIS  Findings: At least seven metastases involving the liver, the  largest in the dome measuring approximately 2.6 x 2.6 cm (series 2,  image 49). Porta hepatis lymphadenopathy measuring approximately  2.3 x 3.3 cm (image 61). No lymphadenopathy elsewhere in the  abdomen or pelvis. Approximate 2.0 x 1.3 cm nodule involving the  medial limb of the right adrenal gland. Normal-appearing left  adrenal gland. Normal spleen, pancreas, and right kidney.  Hydronephrosis and mild atrophy involving the left kidney, filled  with the intravenous contrast which had been administered for the  earlier CT examination; ureter not dilated, consistent with  congenital UPJ stenosis. No parenchymal abnormalities involving  the left kidney. Gallbladder contracted as there is a large amount  of food within the normal-appearing stomach. No biliary ductal  dilation. Moderate aorto-iliac atherosclerosis without aneurysm.  Normal-appearing small bowel and colon. Normal appendix in the  right upper pelvis. No ascites. Urinary bladder filled with oral  contrast material and normal in appearance. Mild median lobe  prostate gland enlargement. Normal seminal vesicles. Bone window  images demonstrate degenerative changes in the hips, sacroiliac  joints, and in the facets of the lumbar spine, but no evidence of  osseous metastatic disease.  IMPRESSION:  1. At least seven liver metastases, the largest in the dome    measured above.  2. Metastatic lymphadenopathy in the porta hepatis. No  lymphadenopathy elsewhere in the abdomen or pelvis.  3. Nodule involving the right adrenal glands, indeterminate, but  likely metastatic disease.  4. 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  5. Mild median lobe prostate gland enlargement.  Original Report Authenticated By: Arnell Sieving, M.D.  06/30/11  *RADIOLOGY REPORT*  Clinical Data: Cranial mass right temporal region. Right-sided  headaches. Palpable mass for 3 months.  CT HEAD WITHOUT AND WITH CONTRAST  Technique: Contiguous axial images were obtained from the base of  the skull through the vertex without and with intravenous contrast.  Contrast: 75mL OMNIPAQUE IOHEXOL 300 MG/ML IV SOLN  Comparison: None.  Findings: A heterogeneously enhancing lytic mass lesion is centered  on the right fronto-temporal skull. The lesion measures 6.0 x 6.4  x 4.0 cm. There is significant intracranial extension with marked  surrounding hypoattenuation compatible with edema. There is mass  effect with midline shift of 12 mm at the foramen of Monro. There  is significant effacement of the right lateral ventricle as well as  the basal cisterns. There is no hydrocephalus.  A second lytic skull lesion is noted in the right parietal region.  The lesion measures 3.0 x 1.7 cm. Soft tissue enhancement is  associated as well. No left-sided lytic lesions are present. No  parenchymal mass lesions are identified.  The paranasal sinuses and mastoid air cells are clear.  IMPRESSION:  1. Right frontal temporal lytic skull lesion with extensive  intracranial soft tissue extension as described.  2. Marked surrounding vasogenic edema and mass effect with  effacement of the right lateral ventricle and 12 mm midline shift.  3. The second lytic lesion with soft tissue enhancement is noted  in the right parietal skull. This raises  concern for metastatic  disease primarily.  These results were called by telephone on 06/30/2011 at 12:15  p.m. to Dr. Ethelda Chick, who verbally acknowledged these results.  Original Report Authenticated By: Jamesetta Orleans. MATTERN, M.D.   REVIEW OF SYSTEMS: Patient reports no health concerns.   PHYSICAL EXAM:  height is  6\' 4"  (1.93 m) and weight is 202 lb (91.627 kg). His oral temperature is 98 F (36.7 C). His blood pressure is 139/75 and his pulse is 63.  General appearance: alert, cooperative, appears stated age and no distress Head: Large, healing right sided surgical incision healing with staples in place. Neck: no adenopathy, no carotid bruit, supple, symmetrical, trachea midline and thyroid not enlarged, symmetric, no tenderness/mass/nodules Lymph nodes: Cervical, supraclavicular, and axillary nodes normal. Resp: clear to auscultation bilaterally and normal percussion bilaterally Cardio: regular rate and rhythm, S1, S2 normal, no murmur, click, rub or gallop GI: soft, non-tender; bowel sounds normal; no masses,  no organomegaly Extremities: extremities normal, atraumatic, no cyanosis or edema Neurologic: Grossly normal     IMPRESSION:  1. 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 2. Status post surgical resection of largest cranial lesion by Dr. Gerlene Fee 3. Healing surgical incision site with staples in place. 4. On seizure prophylaxis with Keppra   PLAN:  1. Patient was encouraged not to drive a vehicle until patient discusses this with Dr. Gerlene Fee 2. Briefly went over patient dictation regards to chemotherapy 3. Briefly discussed radiation therapy. 4. I've offered the [facility] chaplain as a service to the patient. 5. Radiation oncology consultation 6. General surgery consultation for Port-A-Cath insertion. Patient has requested Dr. Franky Macho. 7. Following Port-A-Cath insertion will embark on chemotherapy  consisting of cisplatin and VP-16 days 1 through 5 for cycle 1. We will then consider introduction of CCNU for cycles 2 through 6. 8. Prechemotherapy laboratory work: CBC differential, complete in about panel. 9. Return for followup week of December 10 10. I personally reviewed and went over radiographic studies with the patient. 11. The specific amount time talking about diagnosis, prognosis, and treatment options.  All questions were answered. The patient is a call the clinic with any problems, questions, as or concerns.  Patient and plan discussed with Dr. Glenford Peers and he is in agreement with the aforementioned. Patient was seen and examined by Dr. Mariel Sleet.  A total of 70 minutes was spent in face-to-face consultation with the patient. The entire length of encounter was 90 minutes.  [name] Conner

## 2011-07-17 NOTE — Patient Instructions (Addendum)
[name] Conner  [id number] 05-Jan-1961  [facility]  Discharge Instructions  RECOMMENDATIONS MADE BY THE CONSULTANT AND ANY TEST RESULTS WILL BE SENT TO YOUR REFERRING DOCTOR.   EXAM FINDINGS BY MD TODAY AND SIGNS AND SYMPTOMS TO REPORT TO CLINIC OR PRIMARY MD: Need to get port a catheter placed as soon as possible.  Will also get Radiation Consult as well.  Anticipate starting chemotherapy on 11/26.  MEDICATIONS PRESCRIBED: none  SPECIAL INSTRUCTIONS/FOLLOW-UP: Other (Referral/Appointments) : Appointment with Dr. Franky Macho on 11/15 at 10:30 am - (get there at 10:15 am to complete forms) Appointment for teaching for chemotherapy on 11/20 [facility] at 1:00pm Appointment with Dr. Mitzi Hansen at [facility] in [place] on 11/21 at 9:30am. Chemotherapy tentatively scheduled to start 11/26   I acknowledge that I have been informed and understand all the instructions given to me and received a copy. I do not have any more questions at this time, but understand that I may call the Specialty Clinic at [facility] at [phone] during business hours should I have any further questions or need assistance in obtaining follow-up care.    __________________________________________  _____________  __________ Signature of Patient or Authorized Representative            Date                   Time    __________________________________________ Nurse's Signature

## 2011-07-18 ENCOUNTER — Inpatient Hospital Stay (HOSPITAL_COMMUNITY): Payer: 59

## 2011-07-19 ENCOUNTER — Encounter: Payer: Self-pay | Admitting: Radiation Oncology

## 2011-07-19 ENCOUNTER — Encounter (HOSPITAL_COMMUNITY): Payer: Self-pay | Admitting: Oncology

## 2011-07-19 ENCOUNTER — Encounter (HOSPITAL_COMMUNITY): Payer: Self-pay | Admitting: Pharmacy Technician

## 2011-07-19 ENCOUNTER — Other Ambulatory Visit (HOSPITAL_COMMUNITY): Payer: Self-pay | Admitting: Oncology

## 2011-07-19 DIAGNOSIS — C7951 Secondary malignant neoplasm of bone: Secondary | ICD-10-CM

## 2011-07-19 HISTORY — DX: Secondary malignant neoplasm of bone: C79.51

## 2011-07-19 NOTE — Pre-Procedure Instructions (Signed)
Spoke with Dr Lovell Sheehan about labs from 07/07/2011, ok to use these for surgery 07-23-2011.
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
## 2011-07-19 NOTE — H&P (Signed)
NTS SOAP Note  Vital Signs:  Vitals as of: 07/19/2011: Systolic 123: Diastolic 82: Heart Rate 76: Temp 98.36F: Height 58ft 4in: Weight 198Lbs 0 Ounces: Pain Level 0: BMI 24  BMI : 24.1 kg/m2  Subjective: This 50 Years 0 Months old Male presents for portacath insertion. Needs chemotherapy for metastatic lung cancer.  Review of Symptoms:  Constitutional: unremarkable  recent surgery Eyes:unremarkable  Nose/Mouth/Throat:unremarkable  Cardiovascular:unremarkable  Respiratory: unremarkable  Gastrointestinal:unremarkable  Genitourinary: unremarkable  Musculoskeletal: unremarkable  Skin: unremarkable  Hematolgic/Lymphatic: unremarkable  Allergic/Immunologic:unremarkable    Past Medical History:Reviewed  Past Medical History  Surgical History: brain surgery, lung biopsy Medical Problems: metastatic lung cancer to brain, liver Allergies: nkda Medications: dexamethasone, levetiracetam   Social History: Reviewed   Social History  Age: 50 Years 0 Months Alcohol: No Recreational drug(s): No   Smoking Status: Unknown if ever smoked reviewed on 07/19/2011  Family History: Reviewed  Family History  Is there a family history [id number]    Objective Information:  General: Well appearing, well nourished in no distress.  Neck: Supple without lymphadenopathy.  Heart: RRR, no murmur or gallop. Normal S1, S2. No S3, S4.  Lungs:CTA bilaterally, no wheezes, rhonchi, rales. Breathing unlabored.   Assessment: Metastatic lung cancer, need for central venous access  Diagnosis & Procedure: DiagnosisCode: 197.0, ProcedureCode: 62130,    Plan:Scheduled for portacath insertion on 07/23/11.   Patient Education: Alternative treatments to surgery were discussed with patient (and family).Risks and benefits of procedure were fully explained to the patient (and family) who gave informed consent. Patient/family questions were addressed.  Follow-up: Pending  Surgery

## 2011-07-20 ENCOUNTER — Encounter (HOSPITAL_COMMUNITY)
Admission: RE | Admit: 2011-07-20 | Discharge: 2011-07-20 | Disposition: A | Discharge status: Home / Self Care | Payer: 59 | Source: Ambulatory Visit | Attending: General Surgery | Admitting: General Surgery

## 2011-07-20 ENCOUNTER — Other Ambulatory Visit (HOSPITAL_COMMUNITY): Payer: Self-pay | Admitting: Oncology

## 2011-07-20 ENCOUNTER — Telehealth (HOSPITAL_COMMUNITY): Payer: Self-pay | Admitting: *Deleted

## 2011-07-20 ENCOUNTER — Other Ambulatory Visit (HOSPITAL_COMMUNITY): Payer: Self-pay | Admitting: *Deleted

## 2011-07-20 ENCOUNTER — Encounter (HOSPITAL_COMMUNITY): Payer: Self-pay

## 2011-07-20 DIAGNOSIS — IMO0001 Reserved for inherently not codable concepts without codable children: Secondary | ICD-10-CM | POA: Insufficient documentation

## 2011-07-20 NOTE — Patient Instructions (Addendum)
20 JEHAN BONANO  07/20/2011   Your procedure is scheduled on:  07/23/2011  Report to [facility] at 12:15 AM.  Call this number if you have problems the morning of surgery: [phone]   Remember:   Do not eat food:After Midnight.  Do not drink clear liquids: After Midnight.  Take these medicines the morning of surgery with A SIP OF WATER: Keppra, decadron, protonix   Do not wear jewelry, make-up or nail polish.  Do not wear lotions, powders, or perfumes. You may wear deodorant.  Do not shave 48 hours prior to surgery.  Do not bring valuables to the hospital.  Contacts, dentures or bridgework may not be worn into surgery.  Leave suitcase in the car. After surgery it may be brought to your room.  For patients admitted to the hospital, checkout time is 11:00 AM the day of discharge.   Patients discharged the day of surgery will not be allowed to drive home.  Name and phone number of your driver:   Special Instructions: CHG Shower Shower 2 days before surgery and 1 day before surgery with Hibiclens.   Please read over the following fact sheets that you were given: Pain Booklet

## 2011-07-20 NOTE — Telephone Encounter (Signed)
Dr. Mariel Sleet - I do not see a tx plan built for this patient. He comes for chemo teaching on Tuesday. i think he is getting Cisplatin and vp16 for Cycle I. Then potentially CCNU for cycles 2-6. That is what Tom's note said.

## 2011-07-23 ENCOUNTER — Encounter (HOSPITAL_COMMUNITY)
Admission: RE | Disposition: A | Discharge status: Home / Self Care | Payer: Self-pay | Source: Ambulatory Visit | Attending: General Surgery

## 2011-07-23 ENCOUNTER — Ambulatory Visit (HOSPITAL_COMMUNITY)
Admission: RE | Admit: 2011-07-23 | Discharge: 2011-07-23 | Disposition: A | Discharge status: Home / Self Care | Payer: 59 | Source: Ambulatory Visit | Attending: General Surgery | Admitting: General Surgery

## 2011-07-23 ENCOUNTER — Other Ambulatory Visit (HOSPITAL_COMMUNITY): Payer: Self-pay | Admitting: Oncology

## 2011-07-23 ENCOUNTER — Telehealth (HOSPITAL_COMMUNITY): Payer: Self-pay | Admitting: *Deleted

## 2011-07-23 ENCOUNTER — Ambulatory Visit (HOSPITAL_COMMUNITY): Payer: 59

## 2011-07-23 ENCOUNTER — Ambulatory Visit (HOSPITAL_COMMUNITY): Payer: 59 | Admitting: Anesthesiology

## 2011-07-23 ENCOUNTER — Encounter (HOSPITAL_COMMUNITY): Payer: Self-pay | Admitting: Anesthesiology

## 2011-07-23 DIAGNOSIS — C3490 Malignant neoplasm of unspecified part of unspecified bronchus or lung: Secondary | ICD-10-CM | POA: Insufficient documentation

## 2011-07-23 DIAGNOSIS — C7951 Secondary malignant neoplasm of bone: Secondary | ICD-10-CM

## 2011-07-23 DIAGNOSIS — C7949 Secondary malignant neoplasm of other parts of nervous system: Secondary | ICD-10-CM | POA: Insufficient documentation

## 2011-07-23 DIAGNOSIS — C7931 Secondary malignant neoplasm of brain: Secondary | ICD-10-CM | POA: Insufficient documentation

## 2011-07-23 DIAGNOSIS — C787 Secondary malignant neoplasm of liver and intrahepatic bile duct: Secondary | ICD-10-CM | POA: Insufficient documentation

## 2011-07-23 HISTORY — PX: PORTACATH PLACEMENT: SHX2246

## 2011-07-23 SURGERY — INSERTION, TUNNELED CENTRAL VENOUS DEVICE, WITH PORT
Anesthesia: Monitor Anesthesia Care | Laterality: Left | Wound class: Clean

## 2011-07-23 MED ORDER — HEPARIN SOD (PORK) LOCK FLUSH 100 UNIT/ML IV SOLN
INTRAVENOUS | Status: AC
  Filled 2011-07-23: qty 5

## 2011-07-23 MED ORDER — FENTANYL CITRATE 0.05 MG/ML IJ SOLN
INTRAMUSCULAR | Status: DC | PRN
  Administered 2011-07-23 (×2): 50 ug via INTRAVENOUS

## 2011-07-23 MED ORDER — SODIUM CHLORIDE 0.9 % IV SOLN
INTRAVENOUS | Status: AC
  Filled 2011-07-23 (×2): qty 2000

## 2011-07-23 MED ORDER — HYDROCORTISONE SOD SUCCINATE 100 MG IJ SOLR
100.0000 mg | Freq: Once | INTRAMUSCULAR | Status: AC
  Administered 2011-07-23: 100 mg via INTRAVENOUS
  Filled 2011-07-23: qty 2

## 2011-07-23 MED ORDER — LACTATED RINGERS IV SOLN
INTRAVENOUS | Status: DC
  Administered 2011-07-23: 13:00:00 via INTRAVENOUS

## 2011-07-23 MED ORDER — MIDAZOLAM HCL 2 MG/2ML IJ SOLN
INTRAMUSCULAR | Status: AC
  Filled 2011-07-23: qty 2

## 2011-07-23 MED ORDER — CEFAZOLIN SODIUM 1-5 GM-% IV SOLN
INTRAVENOUS | Status: DC | PRN
  Administered 2011-07-23 (×2): 1 g via INTRAVENOUS

## 2011-07-23 MED ORDER — MIDAZOLAM HCL 2 MG/2ML IJ SOLN
1.0000 mg | INTRAMUSCULAR | Status: DC | PRN
  Administered 2011-07-23: 2 mg via INTRAVENOUS

## 2011-07-23 MED ORDER — CEFAZOLIN SODIUM 1-5 GM-% IV SOLN
INTRAVENOUS | Status: AC
  Filled 2011-07-23: qty 100

## 2011-07-23 MED ORDER — ENOXAPARIN SODIUM 40 MG/0.4ML ~~LOC~~ SOLN
40.0000 mg | Freq: Once | SUBCUTANEOUS | Status: AC
  Administered 2011-07-23: 40 mg via SUBCUTANEOUS

## 2011-07-23 MED ORDER — LIDOCAINE HCL (PF) 1 % IJ SOLN
INTRAMUSCULAR | Status: AC
  Filled 2011-07-23: qty 30

## 2011-07-23 MED ORDER — HEPARIN SOD (PORK) LOCK FLUSH 100 UNIT/ML IV SOLN
INTRAVENOUS | Status: DC | PRN
  Administered 2011-07-23: 500 [IU] via INTRAVENOUS

## 2011-07-23 MED ORDER — CEFAZOLIN SODIUM-DEXTROSE 2-3 GM-% IV SOLR: 2.0000 g | INTRAVENOUS | Status: DC

## 2011-07-23 MED ORDER — PROPOFOL 10 MG/ML IV EMUL
INTRAVENOUS | Status: DC | PRN
  Administered 2011-07-23: 100 ug/kg/min via INTRAVENOUS

## 2011-07-23 MED ORDER — KETOROLAC TROMETHAMINE 30 MG/ML IJ SOLN
30.0000 mg | Freq: Once | INTRAMUSCULAR | Status: AC
  Administered 2011-07-23: 30 mg via INTRAVENOUS

## 2011-07-23 MED ORDER — FENTANYL CITRATE 0.05 MG/ML IJ SOLN
INTRAMUSCULAR | Status: AC
  Filled 2011-07-23: qty 2

## 2011-07-23 MED ORDER — LIDOCAINE HCL 1 % IJ SOLN
INTRAMUSCULAR | Status: DC | PRN
  Administered 2011-07-23: 20 mg via INTRADERMAL

## 2011-07-23 MED ORDER — FENTANYL CITRATE 0.05 MG/ML IJ SOLN: 25.0000 ug | INTRAMUSCULAR | Status: DC | PRN

## 2011-07-23 MED ORDER — SODIUM CHLORIDE 0.9 % IV SOLN
INTRAVENOUS | Status: DC | PRN
  Administered 2011-07-23: 500 mL via INTRAVENOUS

## 2011-07-23 MED ORDER — ENOXAPARIN SODIUM 40 MG/0.4ML ~~LOC~~ SOLN
SUBCUTANEOUS | Status: AC
  Administered 2011-07-23: 40 mg via SUBCUTANEOUS
  Filled 2011-07-23: qty 0.4

## 2011-07-23 MED ORDER — KETOROLAC TROMETHAMINE 30 MG/ML IJ SOLN
INTRAMUSCULAR | Status: AC
  Administered 2011-07-23: 30 mg via INTRAVENOUS
  Filled 2011-07-23: qty 1

## 2011-07-23 MED ORDER — ONDANSETRON HCL 4 MG/2ML IJ SOLN: 4.0000 mg | Freq: Once | INTRAMUSCULAR | Status: DC | PRN

## 2011-07-23 MED ORDER — PROPOFOL 10 MG/ML IV EMUL
INTRAVENOUS | Status: AC
  Filled 2011-07-23: qty 20

## 2011-07-23 MED ORDER — LIDOCAINE HCL (PF) 1 % IJ SOLN
INTRAMUSCULAR | Status: AC
  Filled 2011-07-23: qty 5

## 2011-07-23 MED ORDER — MIDAZOLAM HCL 5 MG/5ML IJ SOLN
INTRAMUSCULAR | Status: DC | PRN
  Administered 2011-07-23 (×2): 1 mg via INTRAVENOUS

## 2011-07-23 MED ORDER — LIDOCAINE HCL (PF) 1 % IJ SOLN
INTRAMUSCULAR | Status: DC | PRN
  Administered 2011-07-23: 7 mL

## 2011-07-23 SURGICAL SUPPLY — 44 items
ADH SKN CLS APL DERMABOND .7 (GAUZE/BANDAGES/DRESSINGS) ×1
APPLIER CLIP 9.375 SM OPEN (CLIP)
APR CLP SM 9.3 20 MLT OPN (CLIP)
BAG DECANTER FOR FLEXI CONT (MISCELLANEOUS) ×2 IMPLANT
BAG HAMPER (MISCELLANEOUS) ×2 IMPLANT
CATH HICKMAN DUAL 12.0 (CATHETERS) IMPLANT
CLIP APPLIE 9.375 SM OPEN (CLIP) IMPLANT
CLOTH BEACON ORANGE TIMEOUT ST (SAFETY) ×2 IMPLANT
COVER LIGHT HANDLE STERIS (MISCELLANEOUS) ×4 IMPLANT
DECANTER SPIKE VIAL GLASS SM (MISCELLANEOUS) ×2 IMPLANT
DERMABOND ADVANCED (GAUZE/BANDAGES/DRESSINGS) ×1
DERMABOND ADVANCED .7 DNX12 (GAUZE/BANDAGES/DRESSINGS) ×1 IMPLANT
DRAPE C-ARM FOLDED MOBILE STRL (DRAPES) ×2 IMPLANT
DURAPREP 26ML APPLICATOR (WOUND CARE) ×2 IMPLANT
ELECT REM PT RETURN 9FT ADLT (ELECTROSURGICAL) ×2
ELECTRODE REM PT RTRN 9FT ADLT (ELECTROSURGICAL) ×1 IMPLANT
GLOVE BIO SURGEON STRL SZ7.5 (GLOVE) ×2 IMPLANT
GLOVE BIOGEL PI IND STRL 7.0 (GLOVE) IMPLANT
GLOVE BIOGEL PI INDICATOR 7.0 (GLOVE) ×1
GLOVE EXAM NITRILE MD LF STRL (GLOVE) ×1 IMPLANT
GLOVE SS BIOGEL STRL SZ 6.5 (GLOVE) IMPLANT
GLOVE SUPERSENSE BIOGEL SZ 6.5 (GLOVE) ×1
GOWN STRL REIN XL XLG (GOWN DISPOSABLE) ×5 IMPLANT
IV NS 500ML (IV SOLUTION) ×2
IV NS 500ML BAXH (IV SOLUTION) ×1 IMPLANT
KIT PORT POWER 8FR ISP MRI (CATHETERS) ×2 IMPLANT
KIT ROOM TURNOVER APOR (KITS) ×2 IMPLANT
MANIFOLD NEPTUNE II (INSTRUMENTS) ×2 IMPLANT
NDL HYPO 18GX1.5 BLUNT FILL (NEEDLE) ×1 IMPLANT
NDL HYPO 25X1 1.5 SAFETY (NEEDLE) ×1 IMPLANT
NEEDLE HYPO 18GX1.5 BLUNT FILL (NEEDLE) IMPLANT
NEEDLE HYPO 25X1 1.5 SAFETY (NEEDLE) ×2 IMPLANT
PACK MINOR (CUSTOM PROCEDURE TRAY) ×2 IMPLANT
PAD ARMBOARD 7.5X6 YLW CONV (MISCELLANEOUS) ×2 IMPLANT
SET BASIN LINEN APH (SET/KITS/TRAYS/PACK) ×2 IMPLANT
SET INTRODUCER 12FR PACEMAKER (SHEATH) IMPLANT
SHEATH COOK PEEL AWAY SET 8F (SHEATH) IMPLANT
SUT PROLENE 3 0 PS 2 (SUTURE) IMPLANT
SUT VIC AB 3-0 SH 27 (SUTURE) ×2
SUT VIC AB 3-0 SH 27X BRD (SUTURE) ×1 IMPLANT
SUT VIC AB 4-0 PS2 27 (SUTURE) ×1 IMPLANT
SYR 20CC LL (SYRINGE) ×2 IMPLANT
SYR CONTROL 10ML LL (SYRINGE) ×2 IMPLANT
SYRINGE 10CC LL (SYRINGE) ×2 IMPLANT

## 2011-07-23 NOTE — Interval H&P Note (Signed)
History and Physical Interval Note:   07/23/2011   1:09 PM   [name] Conner  has presented today for surgery, with the diagnosis of Lung cancer [162.9]  The various methods of treatment have been discussed with the patient and family. After consideration of risks, benefits and other options for treatment, the patient has consented to  Procedure(s): INSERTION PORT-A-CATH as a surgical intervention .  The patients' history has been reviewed, patient examined, no change in status, stable for surgery.  I have reviewed the patients' chart and labs.  Questions were answered to the patient's satisfaction.     Dalia Heading  MD

## 2011-07-23 NOTE — Anesthesia Preprocedure Evaluation (Addendum)
Anesthesia Evaluation  Patient identified by MRN, date of birth, ID band Patient awake    Reviewed: Allergy & Precautions, H&P , NPO status , Patient's Chart, lab work & pertinent test results  History of Anesthesia Complications Negative for: history of anesthetic complications  Airway Mallampati: I      Dental  (+) Edentulous Upper and Partial Lower   Pulmonary COPD (Lung Cancer...metastatic to brain)Current Smoker,    Pulmonary exam normal       Cardiovascular neg cardio ROS Regular Normal    Neuro/Psych    GI/Hepatic GERD-  Medicated and Controlled,  Endo/Other    Renal/GU      Musculoskeletal   Abdominal   Peds  Hematology   Anesthesia Other Findings   Reproductive/Obstetrics                           Anesthesia Physical Anesthesia Plan  ASA: III  Anesthesia Plan: MAC   Post-op Pain Management:    Induction:   Airway Management Planned: Nasal Cannula  Additional Equipment:   Intra-op Plan:   Post-operative Plan:   Informed Consent: I have reviewed the patients History and Physical, chart, labs and discussed the procedure including the risks, benefits and alternatives for the proposed anesthesia with the patient or authorized representative who has indicated his/her understanding and acceptance.     Plan Discussed with:   Anesthesia Plan Comments:         Anesthesia Quick Evaluation

## 2011-07-23 NOTE — Transfer of Care (Signed)
Immediate Anesthesia Transfer of Care Note  Patient: [name] Conner  Procedure(s) Performed:  INSERTION PORT-A-CATH - Power Port (Left chest) attached to 48F catheter in Left Subclavian  Patient Location: PACU  Anesthesia Type: MAC  Level of Consciousness: awake  Airway & Oxygen Therapy: Patient Spontanous Breathing.   Post-op Assessment: Report given to PACU RN, Post -op Vital signs reviewed and stable and Patient moving all extremities  Post vital signs: Reviewed and stable  Complications: No apparent anesthesia complications

## 2011-07-23 NOTE — Telephone Encounter (Signed)
Ok - so I know you are tired of fixing this tx plan but if you wanted him in for every 21 days it is correct. But if you wanted him for every 28 days - it will need to be corrected in the tx plan. Sorry.   Also, if it is for q28 days then that falls on the week of Christmas. We would only be open 3 days. If you want to extend it to the next week then we will only be here for 1/2 the day on Dec 31 and closed on January 1. Please advise.

## 2011-07-23 NOTE — Anesthesia Postprocedure Evaluation (Signed)
Anesthesia Post Note  Patient: [name] Conner  Procedure(s) Performed:  INSERTION PORT-A-CATH - Power Port (Left chest) attached to 52F catheter in Left Subclavian  Anesthesia type: MAC  Patient location: PACU  Post pain: Pain level controlled  Post assessment: Post-op Vital signs reviewed, Patient's Cardiovascular Status Stable, Respiratory Function Stable, Patent Airway, No signs of Nausea or vomiting and Pain level controlled  Last Vitals:  Filed Vitals:   07/23/11 1358  BP: 130/75  Pulse: 60  Temp: 36.6 C  Resp: 12    Post vital signs: Reviewed and stable  Level of consciousness: awake and alert   Complications: No apparent anesthesia complications

## 2011-07-23 NOTE — Anesthesia Procedure Notes (Signed)
Procedure Name: MAC Date/Time: 07/23/2011 1:23 PM Performed by: Minerva Areola Pre-anesthesia Checklist: Patient identified, Patient being monitored, Emergency Drugs available, Timeout performed and Suction available Patient Re-evaluated:Patient Re-evaluated prior to inductionOxygen Delivery Method: Simple face mask

## 2011-07-23 NOTE — Op Note (Signed)
Patient:  [name] Conner  DOB:  1960/09/23  MRN:  [id number]   Preop Diagnosis:  Metastatic lung disease, need for central venous access  Postop Diagnosis:  Same  Procedure:  Port-A-Cath insertion  Surgeon:  Franky Macho, M.D.  Anes:  MAC  Indications:  Patient is a 50 year old white male from metastatic lung cancer who now presents for a Port-A-Cath insertion. He is about to undergo chemotherapy. The risks and benefits of the procedure including bleeding, infection, and pneumothorax were fully explained to the patient, gave informed consent.  Procedure note:  Patient is placed in Trendelenburg position after the left upper chest was prepped and draped using usual sterile technique with DuraPrep. Surgical site confirmation was performed. 1% Xylocaine was used for local anesthesia.  A transverse incision was made below left clavicle. Subcutaneous pocket was then formed. A needle is advanced into the left subclavian vein using the Seldinger technique without difficulty. A guidewire was then introduced through the needle under fluoroscopic guidance. An introducer peel-away sheath were placed over the guidewire. The catheter was inserted through the peel-away sheath the peel-away sheath was removed. The cath was then attached to the port and the port placed in subcutaneous pocket. Adequate positioning was confirmed by fluoroscopy. A power port was inserted. It was flushed with heparin flush. Good backflow was noted in the port. The subcutaneous layer was reapproximated using 3-0 Vicryl interrupted suture. The skin was closed using a 4-0 Vicryl subcuticular suture. Dermabond was then applied.  All tape and needle counts were correct at the end of the procedure. The patient was transferred back to PACU in stable condition. A chest x-ray will performed at that time.      Complications:  None  EBL:  Minimal  Specimen:  None

## 2011-07-23 NOTE — Telephone Encounter (Signed)
Dr. Mariel Sleet (in regards to [facility]) If you want this patient to receive 2 L of NS during his tx daily x 5 days will you include the 2 L NS part under comments in his treatment plan under hydration. It currently says 366ml/hr x 8 hours and I am assuming you want 2L of NS but there isn't anything under comments to reflect that. I don't want anyone in the tx area to get confused! Thanks!

## 2011-07-24 ENCOUNTER — Encounter (HOSPITAL_BASED_OUTPATIENT_CLINIC_OR_DEPARTMENT_OTHER): Payer: 59

## 2011-07-24 DIAGNOSIS — C7A1 Malignant poorly differentiated neuroendocrine tumors: Secondary | ICD-10-CM

## 2011-07-24 DIAGNOSIS — C787 Secondary malignant neoplasm of liver and intrahepatic bile duct: Secondary | ICD-10-CM

## 2011-07-24 DIAGNOSIS — C7951 Secondary malignant neoplasm of bone: Secondary | ICD-10-CM

## 2011-07-24 DIAGNOSIS — C7A8 Other malignant neuroendocrine tumors: Secondary | ICD-10-CM

## 2011-07-24 DIAGNOSIS — C3490 Malignant neoplasm of unspecified part of unspecified bronchus or lung: Secondary | ICD-10-CM

## 2011-07-24 LAB — CBC
HCT: 46.7 % (ref 39.0–52.0)
MCHC: 32.8 g/dL (ref 30.0–36.0)
MCV: 91.7 fL (ref 78.0–100.0)
RDW: 15.2 % (ref 11.5–15.5)
WBC: 14.5 10*3/uL — ABNORMAL HIGH (ref 4.0–10.5)

## 2011-07-24 LAB — DIFFERENTIAL
Basophils Absolute: 0 10*3/uL (ref 0.0–0.1)
Eosinophils Relative: 1 % (ref 0–5)
Lymphocytes Relative: 8 % — ABNORMAL LOW (ref 12–46)
Monocytes Absolute: 1 10*3/uL (ref 0.1–1.0)

## 2011-07-24 LAB — COMPREHENSIVE METABOLIC PANEL
AST: 25 U/L (ref 0–37)
BUN: 21 mg/dL (ref 6–23)
CO2: 25 mEq/L (ref 19–32)
Calcium: 9.5 mg/dL (ref 8.4–10.5)
Creatinine, Ser: 0.83 mg/dL (ref 0.50–1.35)
GFR calc Af Amer: >90 mL/min (ref >90)
GFR calc non Af Amer: >90 mL/min (ref >90)

## 2011-07-24 MED ORDER — LORAZEPAM 0.5 MG PO TABS: 0.5000 mg | ORAL_TABLET | ORAL | Status: AC | PRN

## 2011-07-24 MED ORDER — ONDANSETRON HCL 8 MG PO TABS: ORAL_TABLET | ORAL | Status: DC

## 2011-07-24 MED ORDER — PROCHLORPERAZINE 25 MG RE SUPP: 25.0000 mg | Freq: Four times a day (QID) | RECTAL | Status: AC | PRN

## 2011-07-24 NOTE — Progress Notes (Signed)
Chemo teaching done and consent signed for CCNU, Cisplatin, and VP16. All pre meds called into [facility] in [place]. Patient not to take po Dex that was on pre treatment plan orders. Patient already taking Dex at home per neurologist orders and Dr. Mariel Sleet said that he didn't need any additional dex from [place]. Patient will get Dex in his IV before chemo and patient is to continue taking his Dex po as already prescribed. Pt made aware of this. Calcium and VIT D also called into to [facility] in [place]. Pharmacist to pull off of shelf the correct dose (600mg  ca and 1000 iu of vit d) and give to patient. Acyclovir 400mg  po bid #60 with prn refills called into [facility] also d/t patient's hx of fever blisters. Patient currently has a healing fever blister. Patient to take XGEVA q28 days. Patient will get off schedule slightly d/t chemo appts not being exactly 28 days apart to begin with. Dr. Mariel Sleet aware of this and said to just get him on schedule as best as possible. All of this was communicated to patient/wife.

## 2011-07-24 NOTE — Patient Instructions (Addendum)
[facility]   CHEMOTHERAPY INSTRUCTIONS Cisplatin, VP16, CCNU  POTENTIAL SIDE EFFECTS OF TREATMENT: Increased Susceptibility to Infection, Vomiting, Constipation, Hair Thinning, Changes in Character of Skin and Nails (brittleness, dryness,etc.), Pigment Changes (darkening of nail beds, palms of hands, soles of feet, etc.), Bone Marrow Suppression, Abdominal Cramping, Urinary Frequency and Blood in Urine   SELF IMAGE NEEDS AND REFERRALS MADE: Obtain hair accessories as soon as possible (hats)   EDUCATIONAL MATERIALS GIVEN AND REVIEWED: Chemotherapy and You booklet, Care Notes on each drug that will be given  SELF CARE ACTIVITIES WHILE ON CHEMOTHERAPY: Increase your fluid intake 48 hours prior to treatment and drink at least 2 quarts (64 oz of decaff fluids) per day after treatment., No alcohol intake., No aspirin or other medications unless approved by your oncologist., Eat foods that are light and easy to digest., Eat foods at cold or room temperature., No fried, fatty, or spicy foods immediately before or after treatment., Have teeth cleaned professionally before starting treatment. Keep dentures and partial plates clean., Use soft toothbrush and do not use mouthwashes that contain alcohol. Biotene is a good mouthwash that is available at most pharmacies or may be ordered by calling [phone]., Use warm salt water gargles (1 teaspoon salt per 1 quart warm water) before and after meals and at bedtime. Or you may rinse with 2 tablespoons of three -percent hydrogen peroxide mixed in eight ounces of water., Always use sunscreen with SPF (Sun Protection Factor) of 30 or higher. and Use your nausea medication as directed to prevent nausea.   MEDICATIONS: You have been given prescriptions for the following medications:  Zofran  8 mg tablet. May take 1 tablet two times a day if needed for nausea/vomiting.   Ativan 0.5mg  (take 1 to 2 tablets) every 4 hours as  needed for nausea or vomiting.  Compazine suppositories 25mg  1 per rectum every 6 hours as needed for nausea or vomiting   EMLA cream. Apply a quarter sized "glob" to port site 1 hour prior to chemotherapy. Do not rub in. Cover with plastic.   Colace - this is a stool softener. Take 100mg  capsule 2-6 times a day as needed. If you have to take more than 6 capsules of Colace a day call the Cancer Center.  Senna - this is a mild laxative used to treat mild constipation. May take 2 tabs by mouth daily or up to twice a day as needed for mild constipation.   Milk of Magnesia - this is a laxative used to treat moderate to severe constipation. May take 2-4 tablespoons every 8 hours as needed. May increase to 8 tablespoons x 1 dose and if no bowel movement call the Cancer Center.  Follow directions on label and May cause drowsiness, don't drive or operate heavy machinery   SYMPTOMS TO REPORT AS SOON AS POSSIBLE AFTER TREATMENT:  FEVER GREATER THAN 100.5 F  CHILLS WITH OR WITHOUT FEVER  NAUSEA AND VOMITING THAT IS NOT CONTROLLED WITH YOUR NAUSEA MEDICATION  UNUSUAL SHORTNESS OF BREATH  UNUSUAL BRUISING OR BLEEDING  TENDERNESS IN MOUTH AND THROAT WITH OR WITHOUT PRESENCE OF ULCERS  URINARY PROBLEMS  BOWEL PROBLEMS  UNUSUAL RASH    Wear comfortable clothing and clothing appropriate for easy access to any Portacath or PICC line. Let us know if there is anything that we can do to make your therapy better!      I have been informed and understand all of the instructions given  to me and have received a copy. I have been instructed to call the clinic [phone] or my family physician as soon as possible for continued medical care, if indicated. I do not have any more questions at this time but understand that I may call the Cancer Center or the Patient Navigator at [phone] during office hours should I have questions or need assistance in obtaining follow-up  care.      _________________________________________      _______________     __________ Signature of Patient or Authorized Representative        Date                            Time      _________________________________________ Nurse's Signature

## 2011-07-25 ENCOUNTER — Ambulatory Visit
Admission: RE | Admit: 2011-07-25 | Discharge: 2011-07-25 | Disposition: A | Discharge status: Home / Self Care | Payer: 59 | Source: Ambulatory Visit | Attending: Radiation Oncology | Admitting: Radiation Oncology

## 2011-07-25 ENCOUNTER — Encounter: Payer: Self-pay | Admitting: Radiation Oncology

## 2011-07-25 ENCOUNTER — Ambulatory Visit
Admission: RE | Admit: 2011-07-25 | Discharge: 2011-07-25 | Payer: 59 | Source: Ambulatory Visit | Attending: Radiation Oncology | Admitting: Radiation Oncology

## 2011-07-25 VITALS — BP 117/73 | HR 75 | Temp 98.2°F | Ht 76.0 in | Wt 203.8 lb

## 2011-07-25 DIAGNOSIS — C7951 Secondary malignant neoplasm of bone: Secondary | ICD-10-CM

## 2011-07-25 DIAGNOSIS — C7A8 Other malignant neuroendocrine tumors: Secondary | ICD-10-CM

## 2011-07-25 DIAGNOSIS — Z87891 Personal history of nicotine dependence: Secondary | ICD-10-CM | POA: Insufficient documentation

## 2011-07-25 DIAGNOSIS — C7931 Secondary malignant neoplasm of brain: Secondary | ICD-10-CM

## 2011-07-25 DIAGNOSIS — Z8042 Family history of malignant neoplasm of prostate: Secondary | ICD-10-CM | POA: Insufficient documentation

## 2011-07-25 DIAGNOSIS — C7A00 Malignant carcinoid tumor of unspecified site: Secondary | ICD-10-CM | POA: Insufficient documentation

## 2011-07-25 DIAGNOSIS — C7B8 Other secondary neuroendocrine tumors: Secondary | ICD-10-CM | POA: Insufficient documentation

## 2011-07-25 DIAGNOSIS — Z79899 Other long term (current) drug therapy: Secondary | ICD-10-CM | POA: Insufficient documentation

## 2011-07-25 DIAGNOSIS — Z801 Family history of malignant neoplasm of trachea, bronchus and lung: Secondary | ICD-10-CM | POA: Insufficient documentation

## 2011-07-25 NOTE — Progress Notes (Signed)
Pt. Here for a [place] visit.  Has brain metastases from Lung cancer.  Denies any pain.  States he notes some sensation of feeling off balance, but he was informed that this could be a side-effect of his Keppra.  Denies any nausea, vomiting, headaches nor blurred vision.    Lives wife, 4 children, 11 grandchildren.  Brother checks on him frequently.  Brother in attendance today.

## 2011-07-25 NOTE — Progress Notes (Signed)
Please see the Nurse Progress Note in the MD Initial Consult Encounter for this patient. 

## 2011-07-30 ENCOUNTER — Inpatient Hospital Stay (HOSPITAL_COMMUNITY): Payer: 59

## 2011-07-30 ENCOUNTER — Encounter (HOSPITAL_COMMUNITY): Payer: Self-pay | Admitting: General Surgery

## 2011-07-31 ENCOUNTER — Inpatient Hospital Stay (HOSPITAL_COMMUNITY): Payer: 59

## 2011-07-31 ENCOUNTER — Encounter: Payer: Self-pay | Admitting: Radiation Oncology

## 2011-07-31 NOTE — Progress Notes (Signed)
CC:   [name] Conner, M.D. [name] Conner. [name] Sleet, MD  REFERRING PHYSICIAN:  Ladona Conner. 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  HISTORY OF PRESENT ILLNESS:  [name] Conner is a 50 year old male who was admitted to [facility] fairly recently with nausea and vomiting.  The patient noticed an area on his scalp that he thought he was bitten by a horse fly.  This, however, increased in size and was of a throbbing nature.  The patient was worked up with radiographic studies that included a CT scan of the head.  This revealed a right frontotemporal lytic skull lesion which measured approximately 6.4 cm with extensive intracranial soft tissue extension.  The was also marked surrounding vasogenic edema and mass effect associated with a 12 mm midline shift. There was a 2nd lytic lesion most posteriorly as well which was 3 cm. Further workup has included an MRI scan of the head with similar findings which showed a 6.4 cm large, destructive right lateral calvarial mass.  There was a similar but smaller right parietal bone mass with early intracranial extension measuring 3.8 cm and a C2 vertebral body metastasis as well.  There was also a small intra-axial brain metastasis in the anterior left frontal lobe measuring 7 mm and a questionable small dural lesion measuring 6 mm.  Further workup included a CT scan of the chest, abdomen and pelvis.  This did reveal a 4 cm mass in the medial aspect of the superior segment of the right lower lobe, which was felt to likely represent the primary tumor.  There were also at least 7 liver metastases.  Metastatic lymphadenopathy was present in both hila and the mediastinum as well as in the porta hepatis.  These were the major findings.  The patient underwent surgical resection through Dr. Gerlene Fee of the large right frontotemporal lesion.  This returned positive for a poorly-differentiated neuroendocrine  carcinoma and this is felt to be of likely lung origin.  The patient has seen [name] Conner to consider possible systemic treatment options, and I have been asked to see him as well for possible consideration of palliative radiation, especially with respect to the brain.  PAST MEDICAL HISTORY:  History of renal disorder within nephrolithiasis. No history of radiation treatment.  CURRENT MEDICATION:  Decadron, Keppra, Zofran, oxycodone.  ALLERGIES:  No known drug allergies.  FAMILY HISTORY:  The patient has a paternal grandfather who had lung cancer and a maternal grandfather with prostate cancer.  SOCIAL HISTORY:  The patient does have a history of smoking.  He does not drink alcohol or use illicit drugs.  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  The patient has remarried.  REVIEW OF SYSTEMS:  Fully reviewed and documented in the medical chart.  PHYSICAL EXAMINATION:  Vital signs:  Weight 203 pounds.  Blood pressure 117/73, pulse 75, temperature 98.2.  General:  Well-developed male in no acute distress.  Alert and oriented x3.  HEENT:  The patient has a well- healing resection scar on the right consistent with prior resection. Oral cavity clear.  Neck:  Supple without any  lymphadenopathy. Cardiovascular:  Regular rate and rhythm.  Respiratory:  Clear to auscultation bilaterally.  GI:  Abdomen is soft, nontender, normal bowel sounds.  Extremities:  No edema present.  Neurologic:  Grossly normal.  IMPRESSION/PLAN:  [name] Conner is a pleasant 50 year old male with a recent diagnosis of metastatic poorly-differentiated  neuroendocrine carcinoma, likely of lung primary.  He is status post resection of the largest cranial lesion, although he does have similarly-appearing lesion most posterior as well as a couple of apparent possible intracranial metastases as well.  I do believe that the patient is an  appropriate candidate to proceed with a course of whole-brain radiation.  He right now is scheduled for chemotherapy to begin next week but I have been able to discuss this issue with Dr. Mariel Conner, and we believe that it would be appropriate to go ahead and complete a quick 2week course of whole-brain radiation with him starting chemotherapy immediately after this.  I did discuss possible treatment to other areas but based on the patient's lack of significant symptoms in terms of lung disease, for instance, I would hold off on this right now.  This certainly could be entertained at a later date.  The patient does wish to proceed with treatment in [place], and therefore I will coordinate both a simulation and his 1st treatment next Monday at this facility.  All of patient's questions were answered and we did discuss the possible side effects, risks and benefits of such a treatment.    ______________________________ Radene Gunning, M.D., Ph.D. JSM/MEDQ  D:  07/31/2011  T:  07/31/2011  Job:  [id number]

## 2011-08-01 ENCOUNTER — Inpatient Hospital Stay (HOSPITAL_COMMUNITY): Payer: 59

## 2011-08-02 ENCOUNTER — Inpatient Hospital Stay (HOSPITAL_COMMUNITY): Payer: 59

## 2011-08-02 NOTE — Progress Notes (Signed)
Encounter addended by: Delynn Flavin, RN on: 08/02/2011  9:27 AM<BR>     Documentation filed: Charges VN

## 2011-08-02 NOTE — Progress Notes (Signed)
Encounter addended by: Delynn Flavin, RN on: 08/02/2011  9:08 AM<BR>     Documentation filed: Charges VN

## 2011-08-03 ENCOUNTER — Ambulatory Visit (HOSPITAL_COMMUNITY): Payer: 59

## 2011-08-06 ENCOUNTER — Telehealth (HOSPITAL_COMMUNITY): Payer: Self-pay

## 2011-08-06 NOTE — Telephone Encounter (Signed)
States "the back of [name] Conner's throat is white.  When he saw [name] Conner for radiation consult he gave him some medication to use for it and he's completely out of the medication. He is going for radiation today at 11:00am."  Instructed [name] Conner to make sure that the Radiation Oncologist sees his throat while he is there today.

## 2011-08-07 MED ORDER — NYSTATIN 100000 UNIT/ML MT SUSP: 500000.0000 [IU] | Freq: Four times a day (QID) | OROMUCOSAL | Status: AC

## 2011-08-07 NOTE — Progress Notes (Signed)
Encounter addended by: Ardell Isaacs on: 08/07/2011  3:00 PM<BR>     Documentation filed: Orders

## 2011-08-10 ENCOUNTER — Other Ambulatory Visit (HOSPITAL_COMMUNITY): Payer: Self-pay | Admitting: Oncology

## 2011-08-10 DIAGNOSIS — C7A8 Other malignant neuroendocrine tumors: Secondary | ICD-10-CM

## 2011-08-10 DIAGNOSIS — C7951 Secondary malignant neoplasm of bone: Secondary | ICD-10-CM

## 2011-08-10 MED ORDER — OXYCODONE HCL 5 MG PO TABS: ORAL_TABLET | ORAL | Status: DC

## 2011-08-13 ENCOUNTER — Telehealth (HOSPITAL_COMMUNITY): Payer: Self-pay | Admitting: Oncology

## 2011-08-13 ENCOUNTER — Encounter (HOSPITAL_COMMUNITY): Payer: 59 | Attending: Oncology

## 2011-08-13 ENCOUNTER — Ambulatory Visit (HOSPITAL_COMMUNITY): Payer: 59 | Admitting: Oncology

## 2011-08-13 ENCOUNTER — Other Ambulatory Visit (HOSPITAL_COMMUNITY): Payer: Self-pay | Admitting: Oncology

## 2011-08-13 DIAGNOSIS — L089 Local infection of the skin and subcutaneous tissue, unspecified: Secondary | ICD-10-CM | POA: Insufficient documentation

## 2011-08-13 DIAGNOSIS — E871 Hypo-osmolality and hyponatremia: Secondary | ICD-10-CM | POA: Insufficient documentation

## 2011-08-13 DIAGNOSIS — Z5111 Encounter for antineoplastic chemotherapy: Secondary | ICD-10-CM

## 2011-08-13 DIAGNOSIS — C787 Secondary malignant neoplasm of liver and intrahepatic bile duct: Secondary | ICD-10-CM

## 2011-08-13 DIAGNOSIS — C7951 Secondary malignant neoplasm of bone: Secondary | ICD-10-CM

## 2011-08-13 DIAGNOSIS — C7A1 Malignant poorly differentiated neuroendocrine tumors: Secondary | ICD-10-CM

## 2011-08-13 DIAGNOSIS — B362 White piedra: Secondary | ICD-10-CM | POA: Insufficient documentation

## 2011-08-13 DIAGNOSIS — C3490 Malignant neoplasm of unspecified part of unspecified bronchus or lung: Secondary | ICD-10-CM | POA: Insufficient documentation

## 2011-08-13 DIAGNOSIS — C801 Malignant (primary) neoplasm, unspecified: Secondary | ICD-10-CM | POA: Insufficient documentation

## 2011-08-13 DIAGNOSIS — C7A8 Other malignant neuroendocrine tumors: Secondary | ICD-10-CM

## 2011-08-13 MED ORDER — SODIUM CHLORIDE 0.9 % IJ SOLN
INTRAMUSCULAR | Status: AC
  Filled 2011-08-13: qty 10

## 2011-08-13 MED ORDER — SODIUM CHLORIDE 0.9 % IV SOLN
Freq: Once | INTRAVENOUS | Status: AC
  Administered 2011-08-13: 09:00:00 via INTRAVENOUS
  Filled 2011-08-13: qty 5

## 2011-08-13 MED ORDER — SODIUM CHLORIDE 0.9 % IV SOLN
INTRAVENOUS | Status: AC
  Administered 2011-08-13: 09:00:00 via INTRAVENOUS

## 2011-08-13 MED ORDER — DEXAMETHASONE SODIUM PHOSPHATE 4 MG/ML IJ SOLN: 12.0000 mg | Freq: Once | INTRAMUSCULAR | Status: DC

## 2011-08-13 MED ORDER — LORAZEPAM 2 MG/ML IJ SOLN
INTRAMUSCULAR | Status: AC
  Administered 2011-08-13: 0.5 mg via INTRAVENOUS
  Filled 2011-08-13: qty 1

## 2011-08-13 MED ORDER — SODIUM CHLORIDE 0.9 % IV SOLN: 150.0000 mg | Freq: Once | INTRAVENOUS | Status: DC

## 2011-08-13 MED ORDER — HEPARIN SOD (PORK) LOCK FLUSH 100 UNIT/ML IV SOLN
INTRAVENOUS | Status: AC
  Administered 2011-08-13: 500 [IU]
  Filled 2011-08-13: qty 5

## 2011-08-13 MED ORDER — PALONOSETRON HCL INJECTION 0.25 MG/5ML
INTRAVENOUS | Status: AC
  Administered 2011-08-13: 0.25 mg via INTRAVENOUS
  Filled 2011-08-13: qty 5

## 2011-08-13 MED ORDER — SODIUM CHLORIDE 0.9 % IJ SOLN
10.0000 mL | INTRAMUSCULAR | Status: DC | PRN
  Filled 2011-08-13: qty 10

## 2011-08-13 MED ORDER — SODIUM CHLORIDE 0.9 % IV SOLN
20.0000 mg/m2 | Freq: Once | INTRAVENOUS | Status: AC
  Administered 2011-08-13: 44 mg via INTRAVENOUS
  Filled 2011-08-13 (×2): qty 44

## 2011-08-13 MED ORDER — POTASSIUM CHLORIDE 2 MEQ/ML IV SOLN: Freq: Once | INTRAVENOUS | Status: DC

## 2011-08-13 MED ORDER — PALONOSETRON HCL INJECTION 0.25 MG/5ML
0.2500 mg | Freq: Once | INTRAVENOUS | Status: AC
  Administered 2011-08-13: 0.25 mg via INTRAVENOUS

## 2011-08-13 MED ORDER — HEPARIN SOD (PORK) LOCK FLUSH 100 UNIT/ML IV SOLN
500.0000 [IU] | Freq: Once | INTRAVENOUS | Status: AC | PRN
  Administered 2011-08-13: 500 [IU]
  Filled 2011-08-13: qty 5

## 2011-08-13 MED ORDER — SODIUM CHLORIDE 0.9 % IV SOLN
100.0000 mg/m2 | Freq: Once | INTRAVENOUS | Status: AC
  Administered 2011-08-13: 220 mg via INTRAVENOUS
  Filled 2011-08-13 (×2): qty 11

## 2011-08-13 MED ORDER — LORAZEPAM 2 MG/ML IJ SOLN
0.5000 mg | Freq: Once | INTRAMUSCULAR | Status: AC
  Administered 2011-08-13: 0.5 mg via INTRAVENOUS

## 2011-08-14 ENCOUNTER — Encounter (HOSPITAL_BASED_OUTPATIENT_CLINIC_OR_DEPARTMENT_OTHER): Payer: 59

## 2011-08-14 DIAGNOSIS — Z5111 Encounter for antineoplastic chemotherapy: Secondary | ICD-10-CM

## 2011-08-14 DIAGNOSIS — C7A8 Other malignant neuroendocrine tumors: Secondary | ICD-10-CM

## 2011-08-14 DIAGNOSIS — C787 Secondary malignant neoplasm of liver and intrahepatic bile duct: Secondary | ICD-10-CM

## 2011-08-14 DIAGNOSIS — C7951 Secondary malignant neoplasm of bone: Secondary | ICD-10-CM

## 2011-08-14 DIAGNOSIS — C7A1 Malignant poorly differentiated neuroendocrine tumors: Secondary | ICD-10-CM

## 2011-08-14 MED ORDER — SODIUM CHLORIDE 0.9 % IJ SOLN
10.0000 mL | INTRAMUSCULAR | Status: DC | PRN
  Filled 2011-08-14: qty 10

## 2011-08-14 MED ORDER — SODIUM CHLORIDE 0.9 % IV SOLN
INTRAVENOUS | Status: AC
  Administered 2011-08-14: 09:00:00 via INTRAVENOUS

## 2011-08-14 MED ORDER — SODIUM CHLORIDE 0.9 % IV SOLN
10.0000 mg | Freq: Once | INTRAVENOUS | Status: AC
  Administered 2011-08-14: 10 mg via INTRAVENOUS
  Filled 2011-08-14: qty 2.5

## 2011-08-14 MED ORDER — HEPARIN SOD (PORK) LOCK FLUSH 100 UNIT/ML IV SOLN
INTRAVENOUS | Status: AC
  Administered 2011-08-14: 500 [IU] via INTRAVENOUS
  Filled 2011-08-14: qty 5

## 2011-08-14 MED ORDER — SODIUM CHLORIDE 0.9 % IV SOLN
20.0000 mg/m2 | Freq: Once | INTRAVENOUS | Status: AC
  Administered 2011-08-14: 44 mg via INTRAVENOUS
  Filled 2011-08-14: qty 44

## 2011-08-14 MED ORDER — DEXAMETHASONE SODIUM PHOSPHATE 10 MG/ML IJ SOLN: 10.0000 mg | Freq: Once | INTRAMUSCULAR | Status: DC

## 2011-08-14 MED ORDER — HEPARIN SOD (PORK) LOCK FLUSH 100 UNIT/ML IV SOLN
500.0000 [IU] | Freq: Once | INTRAVENOUS | Status: AC | PRN
  Administered 2011-08-14: 500 [IU]
  Filled 2011-08-14: qty 5

## 2011-08-14 MED ORDER — SODIUM CHLORIDE 0.9 % IV SOLN
100.0000 mg/m2 | Freq: Once | INTRAVENOUS | Status: AC
  Administered 2011-08-14: 220 mg via INTRAVENOUS
  Filled 2011-08-14: qty 11

## 2011-08-14 NOTE — Progress Notes (Signed)
Infusion complete, patient tolerated well.   

## 2011-08-15 ENCOUNTER — Encounter (HOSPITAL_BASED_OUTPATIENT_CLINIC_OR_DEPARTMENT_OTHER): Payer: 59

## 2011-08-15 ENCOUNTER — Inpatient Hospital Stay (HOSPITAL_COMMUNITY): Payer: 59

## 2011-08-15 DIAGNOSIS — Z5111 Encounter for antineoplastic chemotherapy: Secondary | ICD-10-CM

## 2011-08-15 DIAGNOSIS — C7B8 Other secondary neuroendocrine tumors: Secondary | ICD-10-CM

## 2011-08-15 DIAGNOSIS — C7951 Secondary malignant neoplasm of bone: Secondary | ICD-10-CM

## 2011-08-15 DIAGNOSIS — C7A8 Other malignant neuroendocrine tumors: Secondary | ICD-10-CM

## 2011-08-15 DIAGNOSIS — C7A1 Malignant poorly differentiated neuroendocrine tumors: Secondary | ICD-10-CM

## 2011-08-15 DIAGNOSIS — C787 Secondary malignant neoplasm of liver and intrahepatic bile duct: Secondary | ICD-10-CM

## 2011-08-15 MED ORDER — SODIUM CHLORIDE 0.9 % IV SOLN
10.0000 mg | Freq: Once | INTRAVENOUS | Status: AC
  Administered 2011-08-15: 10 mg via INTRAVENOUS
  Filled 2011-08-15: qty 2.5

## 2011-08-15 MED ORDER — DEXAMETHASONE SODIUM PHOSPHATE 10 MG/ML IJ SOLN: 10.0000 mg | Freq: Once | INTRAMUSCULAR | Status: DC

## 2011-08-15 MED ORDER — HEPARIN SOD (PORK) LOCK FLUSH 100 UNIT/ML IV SOLN
INTRAVENOUS | Status: AC
  Administered 2011-08-15: 500 [IU]
  Filled 2011-08-15: qty 5

## 2011-08-15 MED ORDER — LORAZEPAM 2 MG/ML IJ SOLN
0.5000 mg | Freq: Once | INTRAMUSCULAR | Status: AC
  Administered 2011-08-15: 0.5 mg via INTRAVENOUS

## 2011-08-15 MED ORDER — SODIUM CHLORIDE 0.9 % IJ SOLN
INTRAMUSCULAR | Status: AC
  Administered 2011-08-15: 10 mL
  Filled 2011-08-15: qty 10

## 2011-08-15 MED ORDER — SODIUM CHLORIDE 0.9 % IV SOLN
100.0000 mg/m2 | Freq: Once | INTRAVENOUS | Status: AC
  Administered 2011-08-15: 220 mg via INTRAVENOUS
  Filled 2011-08-15: qty 11

## 2011-08-15 MED ORDER — SODIUM CHLORIDE 0.9 % IV SOLN
20.0000 mg/m2 | Freq: Once | INTRAVENOUS | Status: AC
  Administered 2011-08-15: 44 mg via INTRAVENOUS
  Filled 2011-08-15: qty 44

## 2011-08-15 MED ORDER — SODIUM CHLORIDE 0.9 % IJ SOLN
10.0000 mL | INTRAMUSCULAR | Status: DC | PRN
  Administered 2011-08-15: 10 mL
  Filled 2011-08-15: qty 10

## 2011-08-15 MED ORDER — DENOSUMAB 120 MG/1.7ML ~~LOC~~ SOLN
120.0000 mg | Freq: Once | SUBCUTANEOUS | Status: AC
  Administered 2011-08-15: 120 mg via SUBCUTANEOUS
  Filled 2011-08-15: qty 1.7

## 2011-08-15 MED ORDER — SODIUM CHLORIDE 0.9 % IV SOLN
INTRAVENOUS | Status: AC
  Administered 2011-08-15: 09:00:00 via INTRAVENOUS

## 2011-08-15 MED ORDER — LORAZEPAM 2 MG/ML IJ SOLN
INTRAMUSCULAR | Status: AC
  Filled 2011-08-15: qty 1

## 2011-08-15 MED ORDER — HEPARIN SOD (PORK) LOCK FLUSH 100 UNIT/ML IV SOLN
500.0000 [IU] | Freq: Once | INTRAVENOUS | Status: AC | PRN
  Administered 2011-08-15: 500 [IU]
  Filled 2011-08-15: qty 5

## 2011-08-16 ENCOUNTER — Encounter (HOSPITAL_BASED_OUTPATIENT_CLINIC_OR_DEPARTMENT_OTHER): Payer: 59

## 2011-08-16 VITALS — BP 119/73 | HR 85 | Temp 97.6°F | Wt 201.6 lb

## 2011-08-16 DIAGNOSIS — C7B8 Other secondary neuroendocrine tumors: Secondary | ICD-10-CM

## 2011-08-16 DIAGNOSIS — C7951 Secondary malignant neoplasm of bone: Secondary | ICD-10-CM

## 2011-08-16 DIAGNOSIS — C787 Secondary malignant neoplasm of liver and intrahepatic bile duct: Secondary | ICD-10-CM

## 2011-08-16 DIAGNOSIS — C7A1 Malignant poorly differentiated neuroendocrine tumors: Secondary | ICD-10-CM

## 2011-08-16 DIAGNOSIS — C7A8 Other malignant neuroendocrine tumors: Secondary | ICD-10-CM

## 2011-08-16 MED ORDER — SODIUM CHLORIDE 0.9 % IV SOLN
INTRAVENOUS | Status: DC
  Administered 2011-08-16: 09:00:00 via INTRAVENOUS

## 2011-08-16 MED ORDER — SODIUM CHLORIDE 0.9 % IV SOLN
20.0000 mg/m2 | Freq: Once | INTRAVENOUS | Status: AC
  Administered 2011-08-16: 44 mg via INTRAVENOUS
  Filled 2011-08-16: qty 44

## 2011-08-16 MED ORDER — SODIUM CHLORIDE 0.9 % IV SOLN
150.0000 mg | Freq: Once | INTRAVENOUS | Status: AC
  Administered 2011-08-16: 150 mg via INTRAVENOUS
  Filled 2011-08-16: qty 5

## 2011-08-16 MED ORDER — LORAZEPAM 2 MG/ML IJ SOLN
0.5000 mg | Freq: Once | INTRAMUSCULAR | Status: AC
  Administered 2011-08-16: 0.5 mg via INTRAVENOUS

## 2011-08-16 MED ORDER — LORAZEPAM 2 MG/ML IJ SOLN
INTRAMUSCULAR | Status: AC
  Filled 2011-08-16: qty 1

## 2011-08-16 MED ORDER — SODIUM CHLORIDE 0.9 % IV SOLN: INTRAVENOUS | Status: DC

## 2011-08-16 MED ORDER — SODIUM CHLORIDE 0.9 % IV SOLN
100.0000 mg/m2 | Freq: Once | INTRAVENOUS | Status: AC
  Administered 2011-08-16: 220 mg via INTRAVENOUS
  Filled 2011-08-16: qty 11

## 2011-08-16 MED ORDER — SODIUM CHLORIDE 0.9 % IJ SOLN
INTRAMUSCULAR | Status: AC
  Administered 2011-08-16: 10 mL via INTRAVENOUS
  Filled 2011-08-16: qty 10

## 2011-08-16 MED ORDER — SODIUM CHLORIDE 0.9 % IJ SOLN
10.0000 mL | INTRAMUSCULAR | Status: DC | PRN
  Administered 2011-08-16: 10 mL via INTRAVENOUS
  Filled 2011-08-16: qty 10

## 2011-08-16 MED ORDER — HEPARIN SOD (PORK) LOCK FLUSH 100 UNIT/ML IV SOLN
500.0000 [IU] | Freq: Once | INTRAVENOUS | Status: AC
  Administered 2011-08-16: 500 [IU] via INTRAVENOUS
  Filled 2011-08-16: qty 5

## 2011-08-16 MED ORDER — DEXAMETHASONE SODIUM PHOSPHATE 10 MG/ML IJ SOLN
10.0000 mg | Freq: Once | INTRAMUSCULAR | Status: AC
  Administered 2011-08-16: 10 mg via INTRAVENOUS
  Filled 2011-08-16: qty 1

## 2011-08-16 MED ORDER — PALONOSETRON HCL INJECTION 0.25 MG/5ML
INTRAVENOUS | Status: AC
  Administered 2011-08-16: 0.25 mg via INTRAVENOUS
  Filled 2011-08-16: qty 5

## 2011-08-16 MED ORDER — HEPARIN SOD (PORK) LOCK FLUSH 100 UNIT/ML IV SOLN
INTRAVENOUS | Status: AC
  Administered 2011-08-16: 500 [IU] via INTRAVENOUS
  Filled 2011-08-16: qty 5

## 2011-08-16 MED ORDER — PALONOSETRON HCL INJECTION 0.25 MG/5ML
0.2500 mg | Freq: Once | INTRAVENOUS | Status: AC
  Administered 2011-08-16: 0.25 mg via INTRAVENOUS

## 2011-08-17 ENCOUNTER — Encounter (HOSPITAL_BASED_OUTPATIENT_CLINIC_OR_DEPARTMENT_OTHER): Payer: 59

## 2011-08-17 ENCOUNTER — Other Ambulatory Visit (HOSPITAL_COMMUNITY): Payer: Self-pay | Admitting: Oncology

## 2011-08-17 DIAGNOSIS — C7A8 Other malignant neuroendocrine tumors: Secondary | ICD-10-CM

## 2011-08-17 DIAGNOSIS — C787 Secondary malignant neoplasm of liver and intrahepatic bile duct: Secondary | ICD-10-CM

## 2011-08-17 DIAGNOSIS — C7A1 Malignant poorly differentiated neuroendocrine tumors: Secondary | ICD-10-CM

## 2011-08-17 DIAGNOSIS — Z5189 Encounter for other specified aftercare: Secondary | ICD-10-CM

## 2011-08-17 DIAGNOSIS — C7B8 Other secondary neuroendocrine tumors: Secondary | ICD-10-CM

## 2011-08-17 DIAGNOSIS — C7951 Secondary malignant neoplasm of bone: Secondary | ICD-10-CM

## 2011-08-17 MED ORDER — SODIUM CHLORIDE 0.9 % IJ SOLN
INTRAMUSCULAR | Status: AC
  Filled 2011-08-17: qty 10

## 2011-08-17 MED ORDER — HEPARIN SOD (PORK) LOCK FLUSH 100 UNIT/ML IV SOLN
INTRAVENOUS | Status: AC
  Filled 2011-08-17: qty 5

## 2011-08-17 MED ORDER — SODIUM CHLORIDE 0.9 % IV SOLN
INTRAVENOUS | Status: DC
  Administered 2011-08-17: 09:00:00 via INTRAVENOUS

## 2011-08-17 MED ORDER — LORAZEPAM 2 MG/ML IJ SOLN
0.5000 mg | Freq: Once | INTRAMUSCULAR | Status: AC
  Administered 2011-08-17: 0.5 mg via INTRAVENOUS

## 2011-08-17 MED ORDER — PEGFILGRASTIM INJECTION 6 MG/0.6ML
SUBCUTANEOUS | Status: AC
  Filled 2011-08-17: qty 0.6

## 2011-08-17 MED ORDER — HEPARIN SOD (PORK) LOCK FLUSH 100 UNIT/ML IV SOLN
500.0000 [IU] | Freq: Once | INTRAVENOUS | Status: AC
  Administered 2011-08-17: 500 [IU] via INTRAVENOUS
  Filled 2011-08-17: qty 5

## 2011-08-17 MED ORDER — PEGFILGRASTIM INJECTION 6 MG/0.6ML
6.0000 mg | Freq: Once | SUBCUTANEOUS | Status: AC
  Administered 2011-08-17: 6 mg via SUBCUTANEOUS

## 2011-08-17 MED ORDER — LORAZEPAM 2 MG/ML IJ SOLN
INTRAMUSCULAR | Status: AC
  Filled 2011-08-17: qty 1

## 2011-08-17 MED ORDER — SODIUM CHLORIDE 0.9 % IJ SOLN
10.0000 mL | INTRAMUSCULAR | Status: DC | PRN
  Administered 2011-08-17: 10 mL via INTRAVENOUS
  Filled 2011-08-17: qty 10

## 2011-08-20 ENCOUNTER — Other Ambulatory Visit (HOSPITAL_COMMUNITY): Payer: Self-pay | Admitting: Oncology

## 2011-08-20 ENCOUNTER — Other Ambulatory Visit (HOSPITAL_COMMUNITY): Payer: Self-pay | Admitting: *Deleted

## 2011-08-20 ENCOUNTER — Encounter (HOSPITAL_BASED_OUTPATIENT_CLINIC_OR_DEPARTMENT_OTHER): Payer: 59

## 2011-08-20 ENCOUNTER — Inpatient Hospital Stay (HOSPITAL_COMMUNITY): Payer: 59

## 2011-08-20 DIAGNOSIS — C7951 Secondary malignant neoplasm of bone: Secondary | ICD-10-CM

## 2011-08-20 DIAGNOSIS — C7A8 Other malignant neuroendocrine tumors: Secondary | ICD-10-CM

## 2011-08-20 DIAGNOSIS — C7A1 Malignant poorly differentiated neuroendocrine tumors: Secondary | ICD-10-CM

## 2011-08-20 LAB — DIFFERENTIAL
Eosinophils Relative: 1 % (ref 0–5)
Lymphocytes Relative: 39 % (ref 12–46)
Lymphs Abs: 0.4 10*3/uL — ABNORMAL LOW (ref 0.7–4.0)
Monocytes Absolute: 0 10*3/uL — ABNORMAL LOW (ref 0.1–1.0)
Monocytes Relative: 1 % — ABNORMAL LOW (ref 3–12)

## 2011-08-20 LAB — COMPREHENSIVE METABOLIC PANEL
ALT: 70 U/L — ABNORMAL HIGH (ref 0–53)
CO2: 27 mEq/L (ref 19–32)
Calcium: 8.9 mg/dL (ref 8.4–10.5)
Creatinine, Ser: 0.81 mg/dL (ref 0.50–1.35)
GFR calc Af Amer: >90 mL/min (ref >90)
GFR calc non Af Amer: >90 mL/min (ref >90)
Glucose, Bld: 96 mg/dL (ref 70–99)
Sodium: 129 mEq/L — ABNORMAL LOW (ref 135–145)

## 2011-08-20 LAB — CBC
HCT: 44.5 % (ref 39.0–52.0)
Hemoglobin: 15.8 g/dL (ref 13.0–17.0)
MCV: 87.1 fL (ref 78.0–100.0)
RBC: 5.11 MIL/uL (ref 4.22–5.81)
WBC: 1 10*3/uL — CL (ref 4.0–10.5)

## 2011-08-20 NOTE — Progress Notes (Signed)
Labs drawn today for cbc/diff,cmp 

## 2011-08-21 ENCOUNTER — Encounter (HOSPITAL_BASED_OUTPATIENT_CLINIC_OR_DEPARTMENT_OTHER): Payer: 59 | Admitting: Oncology

## 2011-08-21 ENCOUNTER — Telehealth (HOSPITAL_COMMUNITY): Payer: Self-pay | Admitting: Oncology

## 2011-08-21 ENCOUNTER — Inpatient Hospital Stay (HOSPITAL_COMMUNITY): Payer: 59

## 2011-08-21 ENCOUNTER — Ambulatory Visit (HOSPITAL_COMMUNITY)
Admission: RE | Admit: 2011-08-21 | Discharge: 2011-08-21 | Disposition: A | Discharge status: Home / Self Care | Payer: 59 | Source: Ambulatory Visit | Attending: Oncology | Admitting: Oncology

## 2011-08-21 VITALS — BP 124/81 | HR 106 | Temp 99.0°F | Wt 193.1 lb

## 2011-08-21 DIAGNOSIS — R058 Other specified cough: Secondary | ICD-10-CM

## 2011-08-21 DIAGNOSIS — E876 Hypokalemia: Secondary | ICD-10-CM

## 2011-08-21 DIAGNOSIS — C7951 Secondary malignant neoplasm of bone: Secondary | ICD-10-CM

## 2011-08-21 DIAGNOSIS — J029 Acute pharyngitis, unspecified: Secondary | ICD-10-CM

## 2011-08-21 DIAGNOSIS — R937 Abnormal findings on diagnostic imaging of other parts of musculoskeletal system: Secondary | ICD-10-CM | POA: Insufficient documentation

## 2011-08-21 DIAGNOSIS — J069 Acute upper respiratory infection, unspecified: Secondary | ICD-10-CM

## 2011-08-21 DIAGNOSIS — R05 Cough: Secondary | ICD-10-CM

## 2011-08-21 DIAGNOSIS — C7A1 Malignant poorly differentiated neuroendocrine tumors: Secondary | ICD-10-CM

## 2011-08-21 DIAGNOSIS — B370 Candidal stomatitis: Secondary | ICD-10-CM

## 2011-08-21 DIAGNOSIS — R059 Cough, unspecified: Secondary | ICD-10-CM

## 2011-08-21 DIAGNOSIS — C3490 Malignant neoplasm of unspecified part of unspecified bronchus or lung: Secondary | ICD-10-CM | POA: Insufficient documentation

## 2011-08-21 NOTE — Progress Notes (Signed)
Patient in for X-rays and complains with sore throat and productive cough.  Back of throat has white patches and is also red.  Examined by PA.

## 2011-08-21 NOTE — Patient Instructions (Addendum)
[name] Conner  [id number] April 17, 1961  [facility]  Discharge Instructions  RECOMMENDATIONS MADE BY THE CONSULTANT AND ANY TEST RESULTS WILL BE SENT TO YOUR REFERRING DOCTOR.   EXAM FINDINGS BY MD TODAY AND SIGNS AND SYMPTOMS TO REPORT TO CLINIC OR PRIMARY MD: You have thrush in the back of your throat.  MEDICATIONS PRESCRIBED: Diflucan 100 mg take 2 pills today then daily until thrush is gone. Duke's Magic Mouth Wash - 5ml  Swish, gargle and swallow or spit out 4 times daily as needed Augmentin 875 /125 mg take 1 pill twice daily for 7 days. Kdur 10 mEq take 1 pill daily Follow label directions  INSTRUCTIONS GIVEN AND DISCUSSED: Other :  If throat and cough does not improve, let us know.  SPECIAL INSTRUCTIONS/FOLLOW-UP: Return to Clinic:  As scheduled.   I acknowledge that I have been informed and understand all the instructions given to me and received a copy. I do not have any more questions at this time, but understand that I may call the Specialty Clinic at [facility] at [phone] during business hours should I have any further questions or need assistance in obtaining follow-up care.    __________________________________________  _____________  __________ Signature of Patient or Authorized Representative            Date                   Time    __________________________________________ Nurse's Signature

## 2011-08-21 NOTE — Progress Notes (Signed)
Subjective: Patient is seen as a walk-in today for sore throat, cough productive of Green sputum, and white spots in mouth.  Patient reports he has a sore throat. He explains that he is been coughing and producing green sputum. He also notes white spots on his buccal mucosa.  He is utilized fluconazole in the past for this. He reports he used the Dukes Magic mouthwash but he reports it made him ill.  The patient's wife reports that she feels as though he is slightly yellow inferior to the orbits. Not sure I can quite appreciate this but his sclera may be minimally jaundiced.  The patient is neutropenic according to allow for performed yesterday. As result we went over signs and symptoms of febrile neutropenia. He knows to report to the emergency room with any fevers, chills, shortness of breath, etc.  I also mentioned to the patient that his potassium is low in therefore he will require some potassium supplementation.  We will call in K-Dur for the patient.  We went over patient education in regards to the reasoning behind his right femur x-ray. Dr. Tyron Russell was reviewing the patient's films and on a bone scan notes that there may be a right femur metastatic lesion. As result it was requested that the patient undergo a right femur x-ray to followup on this worrisome finding. If this truly is metastatic disease, I will get in touch with radiation oncology and see if they feel that radiation to that area would be beneficial and hopefully prevent a pathologic fracture.  Objective: BP 124/81  Pulse 106  Temp(Src) 99 F (37.2 C) (Oral)  Wt 193 lb 1.6 oz (87.59 kg)  Gen.: Patient seen in examination with his wife next him. Is not appear be acute distress. He is alert oriented x3. HEENT: atraumatic normocephalic, sclera possibly mildly icteric.  White plaques noted on buccal mucosa B/L.  posterior pharynx erythema is appreciated. Palpation of the neck is tender.  No tenderness palpation of the frontal  sinuses and maxillary sinuses. Cardiac: Regular rate and rhythm without murmur rub or gallop. Lungs clear to auscultation bilaterally without wheezes rales or rhonchi. Skin: No rashes or lesions Neuro: Patient is alert oriented x3. No focal deficits appreciated.  Assessment: 1. URI, cough productive of green sputum.  Will call in Augmentin 875/125 mg every 12 hours x 7 days.   2. Oral thrush, will call in Diflucan 100 mg Tablets #20 with 1 refill.  He is to take 2 tablets today and then 1 daily thereafter until symptoms resolve.  1 refill given.  Will also call in Duke's magic Mouthwash swish, gargle, and spit 5 mL QID PRN. 3. Hypokalemia, will call in K-Dur 10 mEq to be taken 1 tab daily.  4. Neutropenia, patient will be on Augmentin 875/125 mg this should cover the patient and treat his URI.  Will perform another CBC diff tomorrow.  Patient is to report to ER with fevers, chills, SOB, etc. 5. Mild/minimal icterus, will perform CMET tomorrow with scheduled lab work. 6. 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  7. Status post surgical resection of largest cranial lesion by Dr. Gerlene Fee  8. Healing surgical incision site with staples in place.  9. On seizure prophylaxis with Keppra 10.  I personally reviewed and went over laboratory results with the patient. 11. Patient education regarding the need for the right femur x-ray.  Results pending.  All questions were answered. The patient is call the clinic with any  problems questions or concerns.  Kista Robb

## 2011-08-21 NOTE — Telephone Encounter (Signed)
Spoke to patient via telephone. We went over his right femur x-ray report which does reveal a metastatic lesion in the proximal femur. I took the liberty of contacting Dr. Roselind Messier in Dr. Joellen Jersey absence to discuss the patient's case with him. I gave Dr. Roselind Messier the patient's name and medical record number. He will pass on this information to Dr. Mitzi Hansen. It is reasonable the patient undergo radiation therapy to this right femur to help it heal and also decrease the risk of a pathologic fracture.  I went over this information with the patient. I've asked the patient to call the [facility] cancer Center if he does not hear any information regarding radiation by Friday.  Zilda No

## 2011-08-22 ENCOUNTER — Other Ambulatory Visit (HOSPITAL_COMMUNITY): Payer: Self-pay | Admitting: Oncology

## 2011-08-22 ENCOUNTER — Inpatient Hospital Stay (HOSPITAL_COMMUNITY): Payer: 59

## 2011-08-22 ENCOUNTER — Encounter (HOSPITAL_BASED_OUTPATIENT_CLINIC_OR_DEPARTMENT_OTHER): Payer: 59

## 2011-08-22 ENCOUNTER — Telehealth (HOSPITAL_COMMUNITY): Payer: Self-pay

## 2011-08-22 DIAGNOSIS — C7A1 Malignant poorly differentiated neuroendocrine tumors: Secondary | ICD-10-CM

## 2011-08-22 DIAGNOSIS — C787 Secondary malignant neoplasm of liver and intrahepatic bile duct: Secondary | ICD-10-CM

## 2011-08-22 DIAGNOSIS — C7A8 Other malignant neuroendocrine tumors: Secondary | ICD-10-CM

## 2011-08-22 LAB — CBC
Hemoglobin: 13.5 g/dL (ref 13.0–17.0)
MCH: 30.6 pg (ref 26.0–34.0)
MCV: 86.2 fL (ref 78.0–100.0)
Platelets: 50 10*3/uL — ABNORMAL LOW (ref 150–400)
RBC: 4.41 MIL/uL (ref 4.22–5.81)
WBC: 0.2 10*3/uL — CL (ref 4.0–10.5)

## 2011-08-22 LAB — DIFFERENTIAL

## 2011-08-22 NOTE — Progress Notes (Signed)
Labs drawn today for cbc/diff 

## 2011-08-22 NOTE — Telephone Encounter (Signed)
[name] Conner has criitcal wbc of 0.2

## 2011-08-22 NOTE — Progress Notes (Signed)
Addended by: Oda Kilts on: 08/22/2011 04:28 PM   Modules accepted: Orders

## 2011-08-23 ENCOUNTER — Other Ambulatory Visit (HOSPITAL_COMMUNITY): Payer: Self-pay | Admitting: Oncology

## 2011-08-23 ENCOUNTER — Encounter (HOSPITAL_BASED_OUTPATIENT_CLINIC_OR_DEPARTMENT_OTHER): Payer: 59

## 2011-08-23 ENCOUNTER — Inpatient Hospital Stay (HOSPITAL_COMMUNITY): Payer: 59

## 2011-08-23 VITALS — BP 100/64 | HR 111

## 2011-08-23 DIAGNOSIS — E871 Hypo-osmolality and hyponatremia: Secondary | ICD-10-CM

## 2011-08-23 DIAGNOSIS — C7A1 Malignant poorly differentiated neuroendocrine tumors: Secondary | ICD-10-CM

## 2011-08-23 DIAGNOSIS — C7A8 Other malignant neuroendocrine tumors: Secondary | ICD-10-CM

## 2011-08-23 DIAGNOSIS — Z5189 Encounter for other specified aftercare: Secondary | ICD-10-CM

## 2011-08-23 DIAGNOSIS — C787 Secondary malignant neoplasm of liver and intrahepatic bile duct: Secondary | ICD-10-CM

## 2011-08-23 LAB — BASIC METABOLIC PANEL
BUN: 11 mg/dL (ref 6–23)
Chloride: 91 mEq/L — ABNORMAL LOW (ref 96–112)
GFR calc Af Amer: >90 mL/min (ref >90)
GFR calc non Af Amer: >90 mL/min (ref >90)
Potassium: 3.5 mEq/L (ref 3.5–5.1)
Sodium: 124 mEq/L — ABNORMAL LOW (ref 135–145)

## 2011-08-23 MED ORDER — HEPARIN SOD (PORK) LOCK FLUSH 100 UNIT/ML IV SOLN
500.0000 [IU] | Freq: Once | INTRAVENOUS | Status: AC
  Administered 2011-08-23: 500 [IU] via INTRAVENOUS
  Filled 2011-08-23: qty 5

## 2011-08-23 MED ORDER — SODIUM CHLORIDE 0.9 % IV SOLN
INTRAVENOUS | Status: AC
  Administered 2011-08-23: 09:00:00 via INTRAVENOUS

## 2011-08-23 MED ORDER — HEPARIN SOD (PORK) LOCK FLUSH 100 UNIT/ML IV SOLN
INTRAVENOUS | Status: AC
  Filled 2011-08-23: qty 5

## 2011-08-23 NOTE — Progress Notes (Signed)
I spoke with patient about his mental state and his feelings the other day of wanting to end his life. He said that he didn't think that he needed depression medication and that this is the first time he has ever thought of ending his life. He said that he just needs his son to get out of the house. His son was in a MVA and can not toilet himself or walk or feed himself. He is just feeling stressed he said. He can't get any rest at home. Patient said he will not act on his thought. I told him that if he had these feelings again - that he needed to call someone here at the Cancer center, tell his wife, a friend, etc. He said he would. He said he knew that those feelings were wrong to have. Patient just seems really tired. Jenita Seashore PA-C notified.

## 2011-08-24 ENCOUNTER — Ambulatory Visit (HOSPITAL_COMMUNITY): Payer: 59

## 2011-08-29 ENCOUNTER — Other Ambulatory Visit (HOSPITAL_COMMUNITY): Payer: Self-pay | Admitting: *Deleted

## 2011-08-29 DIAGNOSIS — C7951 Secondary malignant neoplasm of bone: Secondary | ICD-10-CM

## 2011-08-29 DIAGNOSIS — C7A8 Other malignant neuroendocrine tumors: Secondary | ICD-10-CM

## 2011-08-30 ENCOUNTER — Encounter (HOSPITAL_BASED_OUTPATIENT_CLINIC_OR_DEPARTMENT_OTHER): Payer: 59

## 2011-08-30 ENCOUNTER — Telehealth (HOSPITAL_COMMUNITY): Payer: Self-pay | Admitting: *Deleted

## 2011-08-30 DIAGNOSIS — C7951 Secondary malignant neoplasm of bone: Secondary | ICD-10-CM

## 2011-08-30 DIAGNOSIS — C3490 Malignant neoplasm of unspecified part of unspecified bronchus or lung: Secondary | ICD-10-CM

## 2011-08-30 DIAGNOSIS — C7B8 Other secondary neuroendocrine tumors: Secondary | ICD-10-CM

## 2011-08-30 DIAGNOSIS — C7A8 Other malignant neuroendocrine tumors: Secondary | ICD-10-CM

## 2011-08-30 LAB — CBC
Hemoglobin: 11.9 g/dL — ABNORMAL LOW (ref 13.0–17.0)
MCH: 30.6 pg (ref 26.0–34.0)
RBC: 3.89 MIL/uL — ABNORMAL LOW (ref 4.22–5.81)
WBC: 19.2 10*3/uL — ABNORMAL HIGH (ref 4.0–10.5)

## 2011-08-30 LAB — DIFFERENTIAL
Eosinophils Absolute: 0 10*3/uL (ref 0.0–0.7)
Lymphocytes Relative: 9 % — ABNORMAL LOW (ref 12–46)
Lymphs Abs: 1.7 10*3/uL (ref 0.7–4.0)
Monocytes Relative: 4 % (ref 3–12)
Neutro Abs: 15.9 10*3/uL — ABNORMAL HIGH (ref 1.7–7.7)
Neutrophils Relative %: 83 % — ABNORMAL HIGH (ref 43–77)

## 2011-08-30 LAB — BASIC METABOLIC PANEL
GFR calc non Af Amer: >90 mL/min (ref >90)
Glucose, Bld: 95 mg/dL (ref 70–99)
Potassium: 3.1 mEq/L — ABNORMAL LOW (ref 3.5–5.1)
Sodium: 134 mEq/L — ABNORMAL LOW (ref 135–145)

## 2011-08-30 NOTE — Telephone Encounter (Signed)
Patient and wife notified via voicemail that I called in a prescription for KDUR BId x 3 weeks then daily until gone.

## 2011-08-30 NOTE — Progress Notes (Signed)
Addended by: Oda Kilts on: 08/30/2011 10:15 AM   Modules accepted: Orders

## 2011-08-30 NOTE — Progress Notes (Signed)
Labs drawn today for cbc/diff,bmp,keppra drug level

## 2011-08-31 ENCOUNTER — Other Ambulatory Visit (HOSPITAL_COMMUNITY): Payer: 59

## 2011-08-31 ENCOUNTER — Ambulatory Visit (HOSPITAL_COMMUNITY): Payer: 59 | Admitting: Oncology

## 2011-08-31 ENCOUNTER — Encounter (HOSPITAL_BASED_OUTPATIENT_CLINIC_OR_DEPARTMENT_OTHER): Payer: 59 | Admitting: Oncology

## 2011-08-31 DIAGNOSIS — C7B8 Other secondary neuroendocrine tumors: Secondary | ICD-10-CM

## 2011-08-31 DIAGNOSIS — B368 Other specified superficial mycoses: Secondary | ICD-10-CM

## 2011-08-31 DIAGNOSIS — L089 Local infection of the skin and subcutaneous tissue, unspecified: Secondary | ICD-10-CM

## 2011-08-31 DIAGNOSIS — C7A090 Malignant carcinoid tumor of the bronchus and lung: Secondary | ICD-10-CM

## 2011-08-31 LAB — OSMOLALITY, URINE: Osmolality, Ur: 578 mOsm/kg (ref 390–1090)

## 2011-08-31 MED ORDER — SULFAMETHOXAZOLE-TMP DS 800-160 MG PO TABS: 1.0000 | ORAL_TABLET | Freq: Two times a day (BID) | ORAL | Status: AC

## 2011-08-31 MED ORDER — FLUCONAZOLE 100 MG PO TABS: 100.0000 mg | ORAL_TABLET | Freq: Every day | ORAL | Status: DC

## 2011-08-31 NOTE — Progress Notes (Signed)
This office note has been dictated.

## 2011-08-31 NOTE — Progress Notes (Signed)
CC:   [name] Conner, M.D. [name] Puffer, MD  DIAGNOSES: 1. 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  It has metastasized to multiple areas,     predominantly the brain, status post resection of a large mass     metastatic to the skull with encroachment upon the brain and shift     of the midline.  That MRI first saw this on 07/01/2011.  The     intracranial extension from the right lateral calvarium was 58 x 55     x 64 mm.  He also appeared to have a parietal bone mass with     intracranial extension as well.  He had a C2 vertebral body     metastasis.  He also has a femur metastasis, presently asymptomatic     from that at this time.  His CT scan of the chest and abdomen     revealed a right lower lobe mass 4.1 x 3.6 cm with also a satellite     nodule in the right lower lobe of 2.0 x 1.8 cm.  He had     emphysematous changes and he also had metastatic disease to the     hilum and mediastinum.  His CT of the abdomen revealed at least 7     liver metastases, lymphadenopathy in the porta hepatis, a nodule     involving the right adrenal gland that was indeterminate but felt     to be most likely metastatic disease.  He has had radiation therapy     following his surgical resection of the brain and skull lesion.  He     has had 1 full cycle now of cisplatin, VP-16, and CCNU     chemotherapy. 2. Tinea corpora, especially of the lower legs. 3. Possible methicillin-resistant Staphylococcus aureus of the groin     on the right and left and left hand and wrist area.  We will treat     the latter with Septra DS b.i.d.  The tinea will be treated with     Diflucan once again. 4. Candidiasis of the oral mucosa treated with Diflucan successfully     in the past.  INTERVAL HISTORY:  This gentleman is here today with his wife because of the rash on his ankles, more prominent on the  right ankle, extending up the lower leg, really tinea-like rash.  He has a little bit of itching. No pain.  He has some on the left ankle as well and foot area.  He has a rash also in the right inguinal area, upper right thigh, and left upper thigh medially, consistent with possible pustular formation from possible MRSA or non-MRSA-like similar disease process.  He also has a couple pustules developing on the left back of the hand and left wrist.  We will treat him as if he may have MRSA with some Septra DS.  He was on Augmentin most recently for an acute bronchitis, but his counts yesterday were fine.  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  He is on about 4 glasses of fluids a day. His sodium quickly came up from last week from 124 to 134.  He otherwise looks good.  Today, he is walking with a cane and I have encouraged him to continue that until Dr. Mitzi Hansen treats the femur.  TREATMENT PLAN:  We slightly modified so that he is only going to receive 3 days of chemotherapy and not 4.  We did that because his counts became very low after the 1st cycle of therapy.  He dropped his white count to 200 and his platelets to 50,000.  They have recovered nicely, however.  I think it is wise just to go ahead and keep him on the 3 day regimen.  We will see him back in a couple of weeks.  He is due for cycle 2 on 09/10/2011.  He is talking very normally today.  He does not appear depressed.  He has been will depress it sounds like, according to his wife, but he looks really quite good today.  He will see my PA, [name] Conner, in a couple of weeks.  I will probably see him after that.    ______________________________ Ladona Horns. Mariel Sleet, MD ESN/MEDQ  D:  08/31/2011  T:  08/31/2011  Job:  [id number]

## 2011-08-31 NOTE — Patient Instructions (Signed)
[facility]  Discharge Instructions [name] Conner  [id number] 11-21-1960  RECOMMENDATIONS MADE BY THE CONSULTANT AND ANY TEST RESULTS WILL BE SENT TO YOUR REFERRING DOCTOR.   EXAM FINDINGS BY MD TODAY AND SIGNS AND SYMPTOMS TO REPORT TO CLINIC OR PRIMARY MD: Exam findings as discussed per Dr. Mariel Sleet; call back if worsening acne-type rash symptoms.  MEDICATIONS PRESCRIBED: Septra, take as directed.    INSTRUCTIONS GIVEN AND DISCUSSED: 1.  Use Dial soap when bathing, especially for the next 7-10 days 2.  Continue to limit your water intake (due to serum sodium levels).  I acknowledge that I have been informed and understand all the instructions given to me and received a copy. I do not have any more questions at this time, but understand that I may call the Specialty Clinic at [facility] at [phone] during business hours should I have any further questions or need assistance in obtaining follow-up care.    __________________________________________  _____________  __________ Signature of Patient or Authorized Representative            Date                   Time    __________________________________________ Nurse's Signature

## 2011-09-03 LAB — LEVETIRACETAM LEVEL

## 2011-09-07 ENCOUNTER — Other Ambulatory Visit (HOSPITAL_COMMUNITY): Payer: 59

## 2011-09-10 ENCOUNTER — Encounter (HOSPITAL_COMMUNITY): Payer: 59 | Attending: Oncology

## 2011-09-10 ENCOUNTER — Other Ambulatory Visit (HOSPITAL_COMMUNITY): Payer: Self-pay | Admitting: Oncology

## 2011-09-10 DIAGNOSIS — Z5111 Encounter for antineoplastic chemotherapy: Secondary | ICD-10-CM

## 2011-09-10 DIAGNOSIS — C7951 Secondary malignant neoplasm of bone: Secondary | ICD-10-CM | POA: Insufficient documentation

## 2011-09-10 DIAGNOSIS — C7B8 Other secondary neuroendocrine tumors: Secondary | ICD-10-CM

## 2011-09-10 DIAGNOSIS — C787 Secondary malignant neoplasm of liver and intrahepatic bile duct: Secondary | ICD-10-CM

## 2011-09-10 DIAGNOSIS — C801 Malignant (primary) neoplasm, unspecified: Secondary | ICD-10-CM | POA: Insufficient documentation

## 2011-09-10 DIAGNOSIS — C3490 Malignant neoplasm of unspecified part of unspecified bronchus or lung: Secondary | ICD-10-CM | POA: Insufficient documentation

## 2011-09-10 DIAGNOSIS — C7A1 Malignant poorly differentiated neuroendocrine tumors: Secondary | ICD-10-CM

## 2011-09-10 DIAGNOSIS — C7A8 Other malignant neuroendocrine tumors: Secondary | ICD-10-CM

## 2011-09-10 LAB — DIFFERENTIAL
Basophils Relative: 2 % — ABNORMAL HIGH (ref 0–1)
Lymphocytes Relative: 10 % — ABNORMAL LOW (ref 12–46)
Lymphs Abs: 1.3 10*3/uL (ref 0.7–4.0)
Monocytes Absolute: 1.2 10*3/uL — ABNORMAL HIGH (ref 0.1–1.0)
Monocytes Relative: 10 % (ref 3–12)
Neutro Abs: 9.1 10*3/uL — ABNORMAL HIGH (ref 1.7–7.7)

## 2011-09-10 LAB — CBC
HCT: 32 % — ABNORMAL LOW (ref 39.0–52.0)
Hemoglobin: 10.8 g/dL — ABNORMAL LOW (ref 13.0–17.0)
MCHC: 33.8 g/dL (ref 30.0–36.0)
RBC: 3.58 MIL/uL — ABNORMAL LOW (ref 4.22–5.81)

## 2011-09-10 LAB — BASIC METABOLIC PANEL
BUN: 7 mg/dL (ref 6–23)
CO2: 22 mEq/L (ref 19–32)
Chloride: 102 mEq/L (ref 96–112)
Glucose, Bld: 124 mg/dL — ABNORMAL HIGH (ref 70–99)
Potassium: 3.7 mEq/L (ref 3.5–5.1)

## 2011-09-10 MED ORDER — SODIUM CHLORIDE 0.9 % IV SOLN
Freq: Once | INTRAVENOUS | Status: AC
  Administered 2011-09-10: 11:00:00 via INTRAVENOUS
  Filled 2011-09-10: qty 5

## 2011-09-10 MED ORDER — PALONOSETRON HCL INJECTION 0.25 MG/5ML
0.2500 mg | Freq: Once | INTRAVENOUS | Status: AC
  Administered 2011-09-10: 0.25 mg via INTRAVENOUS

## 2011-09-10 MED ORDER — SODIUM CHLORIDE 0.9 % IV SOLN
20.0000 mg/m2 | Freq: Once | INTRAVENOUS | Status: AC
  Administered 2011-09-10: 44 mg via INTRAVENOUS
  Filled 2011-09-10: qty 44

## 2011-09-10 MED ORDER — HEPARIN SOD (PORK) LOCK FLUSH 100 UNIT/ML IV SOLN
INTRAVENOUS | Status: AC
  Administered 2011-09-10: 500 [IU]
  Filled 2011-09-10: qty 5

## 2011-09-10 MED ORDER — HEPARIN SOD (PORK) LOCK FLUSH 100 UNIT/ML IV SOLN
500.0000 [IU] | Freq: Once | INTRAVENOUS | Status: AC | PRN
  Administered 2011-09-10: 500 [IU]
  Filled 2011-09-10: qty 5

## 2011-09-10 MED ORDER — PALONOSETRON HCL INJECTION 0.25 MG/5ML
INTRAVENOUS | Status: AC
  Administered 2011-09-10: 0.25 mg via INTRAVENOUS
  Filled 2011-09-10: qty 5

## 2011-09-10 MED ORDER — SODIUM CHLORIDE 0.9 % IV SOLN
100.0000 mg/m2 | Freq: Once | INTRAVENOUS | Status: AC
  Administered 2011-09-10: 220 mg via INTRAVENOUS
  Filled 2011-09-10: qty 11

## 2011-09-10 MED ORDER — LOMUSTINE 40 MG PO CAPS: 80.0000 mg | ORAL_CAPSULE | Freq: Once | ORAL | Status: DC

## 2011-09-10 MED ORDER — SODIUM CHLORIDE 0.9 % IJ SOLN
10.0000 mL | INTRAMUSCULAR | Status: DC | PRN
  Administered 2011-09-10: 10 mL
  Filled 2011-09-10: qty 10

## 2011-09-10 MED ORDER — SODIUM CHLORIDE 0.9 % IJ SOLN
INTRAMUSCULAR | Status: AC
  Administered 2011-09-10: 10 mL
  Filled 2011-09-10: qty 10

## 2011-09-10 MED ORDER — SODIUM CHLORIDE 0.9 % IV SOLN
INTRAVENOUS | Status: DC
  Administered 2011-09-10: 10:00:00 via INTRAVENOUS

## 2011-09-10 MED ORDER — SODIUM CHLORIDE 0.9 % IV SOLN: 150.0000 mg | Freq: Once | INTRAVENOUS | Status: DC

## 2011-09-10 MED ORDER — DEXAMETHASONE SODIUM PHOSPHATE 4 MG/ML IJ SOLN: 12.0000 mg | Freq: Once | INTRAMUSCULAR | Status: DC

## 2011-09-10 NOTE — Progress Notes (Signed)
Tolerated chemo well. Home accompanied by spouse.

## 2011-09-11 ENCOUNTER — Encounter (HOSPITAL_BASED_OUTPATIENT_CLINIC_OR_DEPARTMENT_OTHER): Payer: 59

## 2011-09-11 ENCOUNTER — Other Ambulatory Visit (HOSPITAL_COMMUNITY): Payer: Self-pay | Admitting: Oncology

## 2011-09-11 ENCOUNTER — Telehealth (HOSPITAL_COMMUNITY): Payer: Self-pay | Admitting: Oncology

## 2011-09-11 DIAGNOSIS — Z5111 Encounter for antineoplastic chemotherapy: Secondary | ICD-10-CM

## 2011-09-11 DIAGNOSIS — C7A1 Malignant poorly differentiated neuroendocrine tumors: Secondary | ICD-10-CM

## 2011-09-11 DIAGNOSIS — C7951 Secondary malignant neoplasm of bone: Secondary | ICD-10-CM

## 2011-09-11 DIAGNOSIS — C7B8 Other secondary neuroendocrine tumors: Secondary | ICD-10-CM

## 2011-09-11 DIAGNOSIS — C7A8 Other malignant neuroendocrine tumors: Secondary | ICD-10-CM

## 2011-09-11 DIAGNOSIS — C787 Secondary malignant neoplasm of liver and intrahepatic bile duct: Secondary | ICD-10-CM

## 2011-09-11 MED ORDER — ETOPOSIDE CHEMO INJECTION 20 MG/ML
100.0000 mg/m2 | Freq: Once | INTRAVENOUS | Status: AC
  Administered 2011-09-11: 220 mg via INTRAVENOUS
  Filled 2011-09-11: qty 11

## 2011-09-11 MED ORDER — LOMUSTINE 40 MG PO CAPS: 80.0000 mg | ORAL_CAPSULE | Freq: Once | ORAL | Status: DC

## 2011-09-11 MED ORDER — HEPARIN SOD (PORK) LOCK FLUSH 100 UNIT/ML IV SOLN
500.0000 [IU] | Freq: Once | INTRAVENOUS | Status: AC | PRN
  Administered 2011-09-11: 500 [IU]
  Filled 2011-09-11: qty 5

## 2011-09-11 MED ORDER — DEXAMETHASONE SODIUM PHOSPHATE 10 MG/ML IJ SOLN: 10.0000 mg | Freq: Once | INTRAMUSCULAR | Status: DC

## 2011-09-11 MED ORDER — HEPARIN SOD (PORK) LOCK FLUSH 100 UNIT/ML IV SOLN
INTRAVENOUS | Status: AC
  Administered 2011-09-11: 500 [IU]
  Filled 2011-09-11: qty 5

## 2011-09-11 MED ORDER — LOMUSTINE 40 MG PO CAPS
80.0000 mg | ORAL_CAPSULE | Freq: Once | ORAL | Status: AC
  Administered 2011-09-11: 80 mg via ORAL
  Filled 2011-09-11: qty 2

## 2011-09-11 MED ORDER — SODIUM CHLORIDE 0.9 % IJ SOLN
INTRAMUSCULAR | Status: AC
  Administered 2011-09-11: 10 mL
  Filled 2011-09-11: qty 10

## 2011-09-11 MED ORDER — SODIUM CHLORIDE 0.9 % IV SOLN
INTRAVENOUS | Status: DC
  Administered 2011-09-11: 1500 mL via INTRAVENOUS

## 2011-09-11 MED ORDER — SODIUM CHLORIDE 0.9 % IV SOLN
20.0000 mg/m2 | Freq: Once | INTRAVENOUS | Status: AC
  Administered 2011-09-11: 44 mg via INTRAVENOUS
  Filled 2011-09-11: qty 44

## 2011-09-11 MED ORDER — SODIUM CHLORIDE 0.9 % IJ SOLN
10.0000 mL | INTRAMUSCULAR | Status: DC | PRN
  Administered 2011-09-11: 10 mL
  Filled 2011-09-11: qty 10

## 2011-09-11 MED ORDER — SODIUM CHLORIDE 0.9 % IV SOLN
Freq: Once | INTRAVENOUS | Status: AC
  Administered 2011-09-11: 10 mg via INTRAVENOUS
  Filled 2011-09-11: qty 1

## 2011-09-12 ENCOUNTER — Encounter (HOSPITAL_BASED_OUTPATIENT_CLINIC_OR_DEPARTMENT_OTHER): Payer: 59

## 2011-09-12 DIAGNOSIS — C7A098 Malignant carcinoid tumors of other sites: Secondary | ICD-10-CM

## 2011-09-12 DIAGNOSIS — C7B8 Other secondary neuroendocrine tumors: Secondary | ICD-10-CM

## 2011-09-12 DIAGNOSIS — C7A090 Malignant carcinoid tumor of the bronchus and lung: Secondary | ICD-10-CM

## 2011-09-12 DIAGNOSIS — C7951 Secondary malignant neoplasm of bone: Secondary | ICD-10-CM

## 2011-09-12 DIAGNOSIS — Z5111 Encounter for antineoplastic chemotherapy: Secondary | ICD-10-CM

## 2011-09-12 DIAGNOSIS — C7A8 Other malignant neuroendocrine tumors: Secondary | ICD-10-CM

## 2011-09-12 LAB — LEVETIRACETAM LEVEL: Levetiracetam Lvl: 11.9 ug/mL (ref 5.0–30.0)

## 2011-09-12 MED ORDER — SODIUM CHLORIDE 0.9 % IV SOLN
100.0000 mg/m2 | Freq: Once | INTRAVENOUS | Status: AC
  Administered 2011-09-12: 220 mg via INTRAVENOUS
  Filled 2011-09-12: qty 11

## 2011-09-12 MED ORDER — HEPARIN SOD (PORK) LOCK FLUSH 100 UNIT/ML IV SOLN
500.0000 [IU] | Freq: Once | INTRAVENOUS | Status: AC | PRN
  Administered 2011-09-12: 500 [IU]
  Filled 2011-09-12: qty 5

## 2011-09-12 MED ORDER — SODIUM CHLORIDE 0.9 % IJ SOLN
10.0000 mL | INTRAMUSCULAR | Status: DC | PRN
  Administered 2011-09-12: 10 mL
  Filled 2011-09-12: qty 10

## 2011-09-12 MED ORDER — DEXAMETHASONE SODIUM PHOSPHATE 10 MG/ML IJ SOLN: 10.0000 mg | Freq: Once | INTRAMUSCULAR | Status: DC

## 2011-09-12 MED ORDER — HEPARIN SOD (PORK) LOCK FLUSH 100 UNIT/ML IV SOLN
INTRAVENOUS | Status: AC
  Administered 2011-09-12: 500 [IU]
  Filled 2011-09-12: qty 5

## 2011-09-12 MED ORDER — SODIUM CHLORIDE 0.9 % IV SOLN
10.0000 mg | Freq: Once | INTRAVENOUS | Status: AC
  Administered 2011-09-12: 10 mg via INTRAVENOUS
  Filled 2011-09-12: qty 2.5

## 2011-09-12 MED ORDER — SODIUM CHLORIDE 0.9 % IV SOLN
20.0000 mg/m2 | Freq: Once | INTRAVENOUS | Status: AC
  Administered 2011-09-12: 44 mg via INTRAVENOUS
  Filled 2011-09-12: qty 44

## 2011-09-12 MED ORDER — SODIUM CHLORIDE 0.9 % IV SOLN
INTRAVENOUS | Status: AC
  Administered 2011-09-12: 2000 mL via INTRAVENOUS

## 2011-09-12 MED ORDER — SODIUM CHLORIDE 0.9 % IJ SOLN
INTRAMUSCULAR | Status: AC
  Filled 2011-09-12: qty 10

## 2011-09-12 NOTE — Progress Notes (Signed)
Tolerated chemo well today. Will return on thurs for iv flds.

## 2011-09-13 ENCOUNTER — Inpatient Hospital Stay (HOSPITAL_COMMUNITY): Payer: 59

## 2011-09-13 ENCOUNTER — Encounter (HOSPITAL_BASED_OUTPATIENT_CLINIC_OR_DEPARTMENT_OTHER): Payer: 59

## 2011-09-13 ENCOUNTER — Ambulatory Visit (HOSPITAL_COMMUNITY): Payer: 59

## 2011-09-13 DIAGNOSIS — C7A098 Malignant carcinoid tumors of other sites: Secondary | ICD-10-CM

## 2011-09-13 DIAGNOSIS — C7951 Secondary malignant neoplasm of bone: Secondary | ICD-10-CM

## 2011-09-13 DIAGNOSIS — C7A8 Other malignant neuroendocrine tumors: Secondary | ICD-10-CM

## 2011-09-13 DIAGNOSIS — C7B8 Other secondary neuroendocrine tumors: Secondary | ICD-10-CM

## 2011-09-13 DIAGNOSIS — Z5189 Encounter for other specified aftercare: Secondary | ICD-10-CM

## 2011-09-13 DIAGNOSIS — C7A090 Malignant carcinoid tumor of the bronchus and lung: Secondary | ICD-10-CM

## 2011-09-13 MED ORDER — PEGFILGRASTIM INJECTION 6 MG/0.6ML
6.0000 mg | Freq: Once | SUBCUTANEOUS | Status: AC
Start: 2011-09-13 — End: 2011-09-13
  Administered 2011-09-13: 6 mg via SUBCUTANEOUS

## 2011-09-13 MED ORDER — HEPARIN SOD (PORK) LOCK FLUSH 100 UNIT/ML IV SOLN
INTRAVENOUS | Status: AC
  Filled 2011-09-13: qty 5

## 2011-09-13 MED ORDER — SODIUM CHLORIDE 0.9 % IJ SOLN
INTRAMUSCULAR | Status: AC
  Filled 2011-09-13: qty 10

## 2011-09-13 MED ORDER — PEGFILGRASTIM INJECTION 6 MG/0.6ML
SUBCUTANEOUS | Status: AC
  Filled 2011-09-13: qty 0.6

## 2011-09-13 MED ORDER — SODIUM CHLORIDE 0.9 % IV SOLN
INTRAVENOUS | Status: AC
  Administered 2011-09-13: 2000 mL via INTRAVENOUS

## 2011-09-13 MED ORDER — SODIUM CHLORIDE 0.9 % IJ SOLN
10.0000 mL | INTRAMUSCULAR | Status: DC | PRN
  Administered 2011-09-13: 10 mL via INTRAVENOUS
  Filled 2011-09-13: qty 10

## 2011-09-14 ENCOUNTER — Ambulatory Visit (HOSPITAL_COMMUNITY): Payer: 59

## 2011-09-21 ENCOUNTER — Ambulatory Visit (HOSPITAL_COMMUNITY): Payer: 59 | Admitting: Oncology

## 2011-09-21 ENCOUNTER — Other Ambulatory Visit (HOSPITAL_COMMUNITY): Payer: 59

## 2011-09-24 ENCOUNTER — Encounter (HOSPITAL_BASED_OUTPATIENT_CLINIC_OR_DEPARTMENT_OTHER): Payer: 59

## 2011-09-24 DIAGNOSIS — C7A090 Malignant carcinoid tumor of the bronchus and lung: Secondary | ICD-10-CM

## 2011-09-24 LAB — COMPREHENSIVE METABOLIC PANEL
Albumin: 3.2 g/dL — ABNORMAL LOW (ref 3.5–5.2)
Alkaline Phosphatase: 121 U/L — ABNORMAL HIGH (ref 39–117)
BUN: 5 mg/dL — ABNORMAL LOW (ref 6–23)
Chloride: 97 mEq/L (ref 96–112)
GFR calc Af Amer: >90 mL/min (ref >90)
Glucose, Bld: 136 mg/dL — ABNORMAL HIGH (ref 70–99)
Potassium: 3.6 mEq/L (ref 3.5–5.1)
Total Bilirubin: 0.4 mg/dL (ref 0.3–1.2)

## 2011-09-24 LAB — CBC
HCT: 33.4 % — ABNORMAL LOW (ref 39.0–52.0)
Hemoglobin: 11.2 g/dL — ABNORMAL LOW (ref 13.0–17.0)
RBC: 3.71 MIL/uL — ABNORMAL LOW (ref 4.22–5.81)
WBC: 7.2 10*3/uL (ref 4.0–10.5)

## 2011-09-24 LAB — DIFFERENTIAL
Basophils Relative: 2 % — ABNORMAL HIGH (ref 0–1)
Eosinophils Absolute: 0.2 10*3/uL (ref 0.0–0.7)
Monocytes Absolute: 0.6 10*3/uL (ref 0.1–1.0)
Neutrophils Relative %: 68 % (ref 43–77)

## 2011-09-24 NOTE — Progress Notes (Signed)
Labs drawn today for cbc/diff,cmp 

## 2011-09-25 ENCOUNTER — Other Ambulatory Visit (HOSPITAL_COMMUNITY): Payer: Self-pay | Admitting: Oncology

## 2011-09-25 ENCOUNTER — Encounter (HOSPITAL_BASED_OUTPATIENT_CLINIC_OR_DEPARTMENT_OTHER): Payer: 59 | Admitting: Oncology

## 2011-09-25 ENCOUNTER — Encounter (HOSPITAL_COMMUNITY): Payer: Self-pay | Admitting: Oncology

## 2011-09-25 DIAGNOSIS — R112 Nausea with vomiting, unspecified: Secondary | ICD-10-CM

## 2011-09-25 DIAGNOSIS — C7951 Secondary malignant neoplasm of bone: Secondary | ICD-10-CM

## 2011-09-25 DIAGNOSIS — C7B8 Other secondary neuroendocrine tumors: Secondary | ICD-10-CM

## 2011-09-25 DIAGNOSIS — C7A8 Other malignant neuroendocrine tumors: Secondary | ICD-10-CM

## 2011-09-25 DIAGNOSIS — C7A090 Malignant carcinoid tumor of the bronchus and lung: Secondary | ICD-10-CM

## 2011-09-25 NOTE — Patient Instructions (Signed)
[name] Conner  [id number] Mar 14, 1961   [facility]  Discharge Instructions  RECOMMENDATIONS MADE BY THE CONSULTANT AND ANY TEST RESULTS WILL BE SENT TO YOUR REFERRING DOCTOR.   EXAM FINDINGS BY MD TODAY AND SIGNS AND SYMPTOMS TO REPORT TO CLINIC OR PRIMARY MD:  You are doing well on chemotherapy and you will be due for your next cycle of chemotherapy on 2/11.  Will check labs there Friday before.   Pharmacy will keep the CCNU that you got from your pharmacy for your next treatment and when you come in for treatment we will get it from them for you to take that day.  Plans are to repeat your CT scans 10 - 14 days after chemotherapy.  Check with your neurosurgeon about when they want to do the MRI of your head.  MEDICATIONS PRESCRIBED: none   INSTRUCTIONS GIVEN AND DISCUSSED: Other :  Report fevers, chills, infections, uncontrolled nausea and vomiting or other problems.  SPECIAL INSTRUCTIONS/FOLLOW-UP: Lab work Needed Friday before chemotherapy is scheduled, chemotherapy on 2/11, Xray Studies Needed 10 - 14 days after chemotherapy and Return to Clinic to see PA a few days after CT scans.   I acknowledge that I have been informed and understand all the instructions given to me and received a copy. I do not have any more questions at this time, but understand that I may call the Specialty Clinic at [facility] at [phone] during business hours should I have any further questions or need assistance in obtaining follow-up care.    __________________________________________  _____________  __________ Signature of Patient or Authorized Representative            Date                   Time    __________________________________________ Nurse's Signature

## 2011-09-25 NOTE — Progress Notes (Signed)
[name] Ruths, MD, MD [address] [place] [address]  1. Neuroendocrine cancer  Cholecalciferol (VITAMIN D-1000 MAX ST PO), lomustine (CEENU) 40 MG capsule, CBC, Differential, Comprehensive metabolic panel, CT Abdomen Pelvis W Contrast, CT Chest W Contrast  2. Bone metastases  CT Abdomen Pelvis W Contrast, CT Chest W Contrast    CURRENT THERAPY:S/P 2 cycles of cisplatin, VP-16, and CCNU chemotherapy. CCNU was not administered on first cycle, but was given on the second cycle.  S/P resection of a large mass metastatic to the skull.  S/P radiation therapy following his surgical resection of the brain and skull lesion.   INTERVAL HISTORY: [name] Conner 51 y.o. 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 It has metastasized to multiple areas,  predominantly the brain, status post resection of a large mass  metastatic to the skull with encroachment upon the brain and shift of the midline.  The patient reports 2 episodes of vomiting, but this is mild for him and he feels as though his nausea and vomiting are well controlled with current anti-emetic regimen.    He admits to a solid appetite, but food does not taste well to him.  He reports that he is hungry, but the foods he likes do not taste well.  He has some staple foods that he tolerates such as grits, eggs, and chicken soup with pepper.    We spent some time going through all of his medications.  His wife reports that he does not take all of his prescribed medications due to side effects listed for each medicine.  I spent time with the patient going over each medication he is prescribed and relayed the importance of each one.  I have asked him to take each side effect listed with a grain of salt and I explained the process in which medications obtain their side-effect  profile.  He understands that each medication carries a risk of side effects, but the ones he has are important in his care.    Otherwise, the patient denies any complaints.  He has lost 10 lbs since beginning chemotherapy.  I have encouraged him to take some ensure or boost to supplement his food intake.  We will need to monitor his weight.  At this time, I do not think an appetite stimulant is indicated because he reports an appetite, just difficult ascertaining food products he enjoys the taste of.   We will move on to cycle 3 of chemotherapy on 10/15/2011 as anticipated.  Following this, we will perform a CT CAP with contrast to restage.  He will then return following the scan to review results.  Patient admits to some ankle edema occasionally and I recommended he continue keeping his feet elevated.   Past Medical History  Diagnosis Date  . Renal disorder     kidney stones  . Bone metastases 07/19/2011  . Neuroendocrine cancer     likely lung primary    has Bone metastases and Neuroendocrine cancer on his problem list.      has no known allergies.  Mr. Tuckerman had no medications administered during this visit.  Past Surgical History  Procedure Date  . Kidney surgery 2010    rattach ureter after kidney stones  . Cardiac catheterization 2011  . Brain surgery nov 2012    tumor removed  .  Lung biopsy   . Craniotomy for tumor 07/2011  . Porta-a-cath insertion 07/23/11   . Powerport   . Portacath placement 07/23/2011    Procedure: INSERTION PORT-A-CATH;  Surgeon: Dalia Heading;  Location: [facility];  Service: General;  Laterality: Left;  Power Port (Left chest) attached to 70F catheter in Left Subclavian    Denies any headaches, dizziness, double vision, fevers, chills, night sweats, nausea, vomiting, diarrhea, constipation, chest pain, heart palpitations, shortness of breath, blood in stool, black tarry stool, urinary pain, urinary burning, urinary frequency, hematuria.   PHYSICAL  EXAMINATION  ECOG PERFORMANCE STATUS: 2 - Symptomatic, <50% confined to bed  Filed Vitals:   09/25/11 0953  BP: 107/73  Pulse: 79  Temp: 97.7 F (36.5 C)    GENERAL:alert, no distress, comfortable, cooperative and smiling SKIN: skin color, texture, turgor are normal, no rashes or significant lesions, dry skin HEAD: Normocephalic, No masses, lesions, tenderness or abnormalities EYES: normal EARS: External ears normal OROPHARYNX:mucous membranes are moist  NECK: supple, no adenopathy, no bruits, thyroid normal size, non-tender, without nodularity, no stridor, non-tender, trachea midline LYMPH:  no palpable lymphadenopathy, no hepatosplenomegaly BREAST:not examined LUNGS: clear to auscultation and percussion, decreased breath sounds HEART: regular rate & rhythm, no murmurs, no gallops, S1 normal and S2 normal ABDOMEN:abdomen soft, non-tender, normal bowel sounds, no masses or organomegaly and no hepatosplenomegaly BACK: Back symmetric, no curvature., No CVA tenderness EXTREMITIES:less then 2 second capillary refill, no joint deformities, effusion, or inflammation, no edema, no skin discoloration, no cyanosis, positive findings:  Positive fingernail clubbing  NEURO: alert & oriented x 3 with fluent speech, no focal motor/sensory deficits, gait normal   LABORATORY DATA: CBC    Component Value Date/Time   WBC 7.2 09/24/2011 0850   RBC 3.71* 09/24/2011 0850   HGB 11.2* 09/24/2011 0850   HCT 33.4* 09/24/2011 0850   PLT 83* 09/24/2011 0850   MCV 90.0 09/24/2011 0850   MCH 30.2 09/24/2011 0850   MCHC 33.5 09/24/2011 0850   RDW 17.9* 09/24/2011 0850   LYMPHSABS 1.3 09/24/2011 0850   MONOABS 0.6 09/24/2011 0850   EOSABS 0.2 09/24/2011 0850   BASOSABS 0.1 09/24/2011 0850      Chemistry      Component Value Date/Time   NA 133* 09/24/2011 0850   K 3.6 09/24/2011 0850   CL 97 09/24/2011 0850   CO2 27 09/24/2011 0850   BUN 5* 09/24/2011 0850   CREATININE 0.87 09/24/2011 0850      Component  Value Date/Time   CALCIUM 10.0 09/24/2011 0850   ALKPHOS 121* 09/24/2011 0850   AST 11 09/24/2011 0850   ALT 14 09/24/2011 0850   BILITOT 0.4 09/24/2011 0850        PATHOLOGY: 07/06/2011  Diagnosis 1. Soft tissue mass, biopsy, Epidural tumor with intracranial extension - METASTATIC POORLY DIFFERENTIATED CARCINOMA WITH NEUROENDOCRINE DIFFERENTIATION. - SEE COMMENT. 2. Soft tissue mass, simple excision - METASTATIC POORLY DIFFERENTIATED CARCINOMA WITH NEUROENDOCRINE DIFFERENTIATION. - SEE COMMENT. 3. Dura mater - METASTATIC POORLY DIFFERENTIATED CARCINOMA WITH NEUROENDOCRINE DIFFERENTIATION. - SEE COMMENT. Microscopic Comment 1. , 2. and 3. By immunohistochemistry, the malignant cells are positive for CD56, synaptophysin, chromogranin, cytokeratin 7, and faintly positive for neuron specific enolase, as well as TTF-1. They are negative for S100, cytokeratin 20, cytokeratin 5/6, GFAP, Melan-A, Napsin, and p63. Overall, the findings are consistent with metastatic poorly differentiated carcinoma with neuroendocrine features and favor metastatic large cell neuroendocrine carcinoma. Dr.Bassam Smir has reviewed the case and is in essential agreement  with this interpretation. (JK:jy) 07/10/11 Pecola Leisure MD Pathologist, Electronic Signature (Case signed 07/10/2011)   ASSESSMENT:  1. 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 It has metastasized to multiple areas,  predominantly the brain, status post resection of a large mass  metastatic to the skull with encroachment upon the brain and shift of the midline. That MRI first saw this on 07/01/2011. The  intracranial extension from the right lateral calvarium was 58 x 55 x 64 mm. He also appeared to have a parietal bone mass with intracranial extension as well. He had a C2 vertebral body  metastasis. He also has a femur metastasis, presently  asymptomatic from that at this time. His CT scan of the chest and abdomen revealed a right lower lobe mass 4.1 x 3.6 cm with also a satellite nodule in the right lower lobe of 2.0 x 1.8 cm. He had emphysematous changes and he also had metastatic disease to the hilum and mediastinum. His CT of the abdomen revealed at least 7 liver metastases, lymphadenopathy in the porta hepatis, a nodule involving the right adrenal gland that was indeterminate but felt to be most likely metastatic disease. He has had radiation therapy following his surgical resection of the brain and skull lesion. He  has had 2 full cycle now of cisplatin, VP-16, and CCNU chemotherapy.  2. Tinea corpora, especially of the lower legs.  3. Possible methicillin-resistant Staphylococcus aureus of the groin on the right and left and left hand and wrist area. Treated with Septra DS  4. Candidiasis of the oral mucosa treated with Diflucan successfully in the past, resolved. 5. Weight loss, 10 lbs weight loss since 08/17/2011. 6. Noncompliance with all medications, patient education provided. 7. Intermittent ankle edema, not seen today during physical exam.    PLAN:  1. Schedule for next cycle of chemotherapy scheduled for 10/15/2011.  This will be his 3rd cycle 2. CT CAP with contrast 10-14 days following cycle 3. 3. Pre-chemo lab work: CBC diff, CMET 4. We reviewed the patient's medications together in light of his numerous medications and the role of each medication. 5. Patient education regarding side effects of his medications  6. Encouraged the patient to keep his feet elevated when resting to prevent or decrease ankle edema.  7. Recommended Ensure or boost food supplementation 8.  Recommended the patient not force ingestion of food products 9. Will closely monitor weight.  He has lost 10 lbs in 6 weeks times. 10. Patient will contact neurosurgeon to see if he has a follow-up appointment.  11. Will perform MRI of brain with and  without contrast the same day of CT scans. 12. Plain X-ray of right femur same day as CT scans 13. Return 2-3 days following CT scans following cycle 3.  All questions were answered. The patient knows to call the clinic with any problems, questions or concerns. We can certainly see the patient much sooner if necessary.  The patient and plan discussed with Glenford Peers, MD and he is in agreement with the aforementioned.  I spent 25 minutes counseling the patient face to face. The total time spent in the appointment was 35 minutes. More than 50% of the time spent with the patient was utilized for counseling and coordination of care.   Kanan Sobek

## 2011-09-28 ENCOUNTER — Encounter (HOSPITAL_BASED_OUTPATIENT_CLINIC_OR_DEPARTMENT_OTHER): Payer: 59

## 2011-09-28 DIAGNOSIS — C7A090 Malignant carcinoid tumor of the bronchus and lung: Secondary | ICD-10-CM

## 2011-09-28 LAB — DIFFERENTIAL
Eosinophils Relative: 1 % (ref 0–5)
Lymphocytes Relative: 11 % — ABNORMAL LOW (ref 12–46)
Monocytes Relative: 8 % (ref 3–12)
Neutrophils Relative %: 78 % — ABNORMAL HIGH (ref 43–77)

## 2011-09-28 LAB — BASIC METABOLIC PANEL
Calcium: 9.2 mg/dL (ref 8.4–10.5)
GFR calc non Af Amer: >90 mL/min (ref >90)
Potassium: 3.9 mEq/L (ref 3.5–5.1)
Sodium: 135 mEq/L (ref 135–145)

## 2011-09-28 LAB — CBC
Hemoglobin: 12.1 g/dL — ABNORMAL LOW (ref 13.0–17.0)
Platelets: 236 10*3/uL (ref 150–400)
RBC: 3.9 MIL/uL — ABNORMAL LOW (ref 4.22–5.81)
WBC: 15.8 10*3/uL — ABNORMAL HIGH (ref 4.0–10.5)

## 2011-09-28 NOTE — Progress Notes (Signed)
Labs drawn today for cbc/diff,bmp 

## 2011-10-04 ENCOUNTER — Ambulatory Visit (HOSPITAL_COMMUNITY): Payer: 59

## 2011-10-12 ENCOUNTER — Encounter (HOSPITAL_COMMUNITY): Payer: 59 | Attending: Oncology

## 2011-10-12 DIAGNOSIS — E2749 Other adrenocortical insufficiency: Secondary | ICD-10-CM | POA: Insufficient documentation

## 2011-10-12 DIAGNOSIS — E876 Hypokalemia: Secondary | ICD-10-CM | POA: Insufficient documentation

## 2011-10-12 DIAGNOSIS — C7A090 Malignant carcinoid tumor of the bronchus and lung: Secondary | ICD-10-CM

## 2011-10-12 DIAGNOSIS — C7A8 Other malignant neuroendocrine tumors: Secondary | ICD-10-CM

## 2011-10-12 DIAGNOSIS — I2699 Other pulmonary embolism without acute cor pulmonale: Secondary | ICD-10-CM | POA: Insufficient documentation

## 2011-10-12 DIAGNOSIS — C801 Malignant (primary) neoplasm, unspecified: Secondary | ICD-10-CM | POA: Insufficient documentation

## 2011-10-12 DIAGNOSIS — C7951 Secondary malignant neoplasm of bone: Secondary | ICD-10-CM | POA: Insufficient documentation

## 2011-10-12 LAB — DIFFERENTIAL
Basophils Absolute: 0.1 10*3/uL (ref 0.0–0.1)
Lymphocytes Relative: 11 % — ABNORMAL LOW (ref 12–46)
Monocytes Absolute: 0.9 10*3/uL (ref 0.1–1.0)
Neutro Abs: 5.5 10*3/uL (ref 1.7–7.7)

## 2011-10-12 LAB — CBC
Hemoglobin: 12 g/dL — ABNORMAL LOW (ref 13.0–17.0)
MCHC: 32.4 g/dL (ref 30.0–36.0)
RBC: 3.93 MIL/uL — ABNORMAL LOW (ref 4.22–5.81)
WBC: 7.9 10*3/uL (ref 4.0–10.5)

## 2011-10-12 LAB — COMPREHENSIVE METABOLIC PANEL
ALT: 16 U/L (ref 0–53)
AST: 16 U/L (ref 0–37)
Alkaline Phosphatase: 102 U/L (ref 39–117)
CO2: 24 mEq/L (ref 19–32)
Chloride: 101 mEq/L (ref 96–112)
Creatinine, Ser: 0.84 mg/dL (ref 0.50–1.35)
GFR calc non Af Amer: >90 mL/min (ref >90)
Total Bilirubin: 0.6 mg/dL (ref 0.3–1.2)

## 2011-10-12 NOTE — Progress Notes (Signed)
Labs drawn today for cbc/diff,cmp 

## 2011-10-15 ENCOUNTER — Other Ambulatory Visit (HOSPITAL_COMMUNITY): Payer: Self-pay | Admitting: Oncology

## 2011-10-15 ENCOUNTER — Encounter (HOSPITAL_BASED_OUTPATIENT_CLINIC_OR_DEPARTMENT_OTHER): Payer: 59

## 2011-10-15 DIAGNOSIS — C7A8 Other malignant neuroendocrine tumors: Secondary | ICD-10-CM

## 2011-10-15 DIAGNOSIS — C7951 Secondary malignant neoplasm of bone: Secondary | ICD-10-CM

## 2011-10-15 DIAGNOSIS — Z5111 Encounter for antineoplastic chemotherapy: Secondary | ICD-10-CM

## 2011-10-15 DIAGNOSIS — C7B8 Other secondary neuroendocrine tumors: Secondary | ICD-10-CM

## 2011-10-15 DIAGNOSIS — C7A090 Malignant carcinoid tumor of the bronchus and lung: Secondary | ICD-10-CM

## 2011-10-15 MED ORDER — SODIUM CHLORIDE 0.9 % IV SOLN
INTRAVENOUS | Status: DC
  Administered 2011-10-15: 10:00:00 via INTRAVENOUS

## 2011-10-15 MED ORDER — SODIUM CHLORIDE 0.9 % IV SOLN
Freq: Once | INTRAVENOUS | Status: AC
  Administered 2011-10-15: 12:00:00 via INTRAVENOUS
  Filled 2011-10-15: qty 5

## 2011-10-15 MED ORDER — SODIUM CHLORIDE 0.9 % IV SOLN
INTRAVENOUS | Status: AC
  Administered 2011-10-15: 12:00:00 via INTRAVENOUS

## 2011-10-15 MED ORDER — HEPARIN SOD (PORK) LOCK FLUSH 100 UNIT/ML IV SOLN
500.0000 [IU] | Freq: Once | INTRAVENOUS | Status: AC | PRN
  Administered 2011-10-15: 500 [IU]
  Filled 2011-10-15: qty 5

## 2011-10-15 MED ORDER — SODIUM CHLORIDE 0.9 % IJ SOLN
10.0000 mL | INTRAMUSCULAR | Status: DC | PRN
  Filled 2011-10-15: qty 10

## 2011-10-15 MED ORDER — SODIUM CHLORIDE 0.9 % IV SOLN: 150.0000 mg | Freq: Once | INTRAVENOUS | Status: DC

## 2011-10-15 MED ORDER — PALONOSETRON HCL INJECTION 0.25 MG/5ML
INTRAVENOUS | Status: AC
  Administered 2011-10-15: 0.25 mg via INTRAVENOUS
  Filled 2011-10-15: qty 5

## 2011-10-15 MED ORDER — SODIUM CHLORIDE 0.9 % IV SOLN
20.0000 mg/m2 | Freq: Once | INTRAVENOUS | Status: AC
  Administered 2011-10-15: 44 mg via INTRAVENOUS
  Filled 2011-10-15: qty 44

## 2011-10-15 MED ORDER — DEXAMETHASONE SODIUM PHOSPHATE 4 MG/ML IJ SOLN: 12.0000 mg | Freq: Once | INTRAMUSCULAR | Status: DC

## 2011-10-15 MED ORDER — SODIUM CHLORIDE 0.9 % IV SOLN
100.0000 mg/m2 | Freq: Once | INTRAVENOUS | Status: AC
  Administered 2011-10-15: 220 mg via INTRAVENOUS
  Filled 2011-10-15: qty 11

## 2011-10-15 MED ORDER — HEPARIN SOD (PORK) LOCK FLUSH 100 UNIT/ML IV SOLN
INTRAVENOUS | Status: AC
  Filled 2011-10-15: qty 5

## 2011-10-15 MED ORDER — PALONOSETRON HCL INJECTION 0.25 MG/5ML
0.2500 mg | Freq: Once | INTRAVENOUS | Status: AC
  Administered 2011-10-15: 0.25 mg via INTRAVENOUS

## 2011-10-15 NOTE — Progress Notes (Signed)
Dr. Mariel Sleet in today to look at rash on pts back, abdomen and rt thigh. Medrol dose pack called to [facility] and pt notified.

## 2011-10-16 ENCOUNTER — Encounter (HOSPITAL_BASED_OUTPATIENT_CLINIC_OR_DEPARTMENT_OTHER): Payer: 59

## 2011-10-16 DIAGNOSIS — C7A8 Other malignant neuroendocrine tumors: Secondary | ICD-10-CM

## 2011-10-16 DIAGNOSIS — C7A090 Malignant carcinoid tumor of the bronchus and lung: Secondary | ICD-10-CM

## 2011-10-16 DIAGNOSIS — C7B8 Other secondary neuroendocrine tumors: Secondary | ICD-10-CM

## 2011-10-16 DIAGNOSIS — Z5111 Encounter for antineoplastic chemotherapy: Secondary | ICD-10-CM

## 2011-10-16 DIAGNOSIS — C7951 Secondary malignant neoplasm of bone: Secondary | ICD-10-CM

## 2011-10-16 MED ORDER — SODIUM CHLORIDE 0.9 % IJ SOLN
INTRAMUSCULAR | Status: AC
  Administered 2011-10-16: 10 mL
  Filled 2011-10-16: qty 10

## 2011-10-16 MED ORDER — SODIUM CHLORIDE 0.9 % IV SOLN
INTRAVENOUS | Status: AC
  Administered 2011-10-16: 1000 mL via INTRAVENOUS

## 2011-10-16 MED ORDER — SODIUM CHLORIDE 0.9 % IV SOLN
100.0000 mg/m2 | Freq: Once | INTRAVENOUS | Status: AC
  Administered 2011-10-16: 220 mg via INTRAVENOUS
  Filled 2011-10-16: qty 11

## 2011-10-16 MED ORDER — SODIUM CHLORIDE 0.9 % IV SOLN
10.0000 mg | Freq: Once | INTRAVENOUS | Status: AC
  Administered 2011-10-16: 10 mg via INTRAVENOUS
  Filled 2011-10-16: qty 2.5

## 2011-10-16 MED ORDER — DEXAMETHASONE SODIUM PHOSPHATE 10 MG/ML IJ SOLN: 10.0000 mg | Freq: Once | INTRAMUSCULAR | Status: DC

## 2011-10-16 MED ORDER — SODIUM CHLORIDE 0.9 % IV SOLN
INTRAVENOUS | Status: AC
  Administered 2011-10-16: 09:00:00 via INTRAVENOUS

## 2011-10-16 MED ORDER — SODIUM CHLORIDE 0.9 % IV SOLN
20.0000 mg/m2 | Freq: Once | INTRAVENOUS | Status: AC
  Administered 2011-10-16: 44 mg via INTRAVENOUS
  Filled 2011-10-16: qty 44

## 2011-10-16 MED ORDER — HEPARIN SOD (PORK) LOCK FLUSH 100 UNIT/ML IV SOLN
INTRAVENOUS | Status: AC
  Administered 2011-10-16: 500 [IU]
  Filled 2011-10-16: qty 5

## 2011-10-16 MED ORDER — SODIUM CHLORIDE 0.9 % IJ SOLN
10.0000 mL | INTRAMUSCULAR | Status: DC | PRN
  Administered 2011-10-16: 10 mL
  Filled 2011-10-16: qty 10

## 2011-10-16 MED ORDER — HEPARIN SOD (PORK) LOCK FLUSH 100 UNIT/ML IV SOLN
500.0000 [IU] | Freq: Once | INTRAVENOUS | Status: AC | PRN
  Administered 2011-10-16: 500 [IU]
  Filled 2011-10-16: qty 5

## 2011-10-16 NOTE — Progress Notes (Signed)
Tolerated well

## 2011-10-17 ENCOUNTER — Encounter (HOSPITAL_BASED_OUTPATIENT_CLINIC_OR_DEPARTMENT_OTHER): Payer: 59

## 2011-10-17 DIAGNOSIS — Z5111 Encounter for antineoplastic chemotherapy: Secondary | ICD-10-CM

## 2011-10-17 DIAGNOSIS — C7B8 Other secondary neuroendocrine tumors: Secondary | ICD-10-CM

## 2011-10-17 DIAGNOSIS — C7A090 Malignant carcinoid tumor of the bronchus and lung: Secondary | ICD-10-CM

## 2011-10-17 DIAGNOSIS — C7951 Secondary malignant neoplasm of bone: Secondary | ICD-10-CM

## 2011-10-17 DIAGNOSIS — C7A8 Other malignant neuroendocrine tumors: Secondary | ICD-10-CM

## 2011-10-17 MED ORDER — SODIUM CHLORIDE 0.9 % IV SOLN
10.0000 mg | Freq: Once | INTRAVENOUS | Status: AC
  Administered 2011-10-17: 10 mg via INTRAVENOUS
  Filled 2011-10-17: qty 2.5

## 2011-10-17 MED ORDER — SODIUM CHLORIDE 0.9 % IJ SOLN
10.0000 mL | INTRAMUSCULAR | Status: DC | PRN
  Administered 2011-10-17: 10 mL
  Filled 2011-10-17: qty 10

## 2011-10-17 MED ORDER — HEPARIN SOD (PORK) LOCK FLUSH 100 UNIT/ML IV SOLN
INTRAVENOUS | Status: AC
  Administered 2011-10-17: 500 [IU]
  Filled 2011-10-17: qty 5

## 2011-10-17 MED ORDER — HEPARIN SOD (PORK) LOCK FLUSH 100 UNIT/ML IV SOLN
500.0000 [IU] | Freq: Once | INTRAVENOUS | Status: AC | PRN
  Administered 2011-10-17: 500 [IU]
  Filled 2011-10-17: qty 5

## 2011-10-17 MED ORDER — SODIUM CHLORIDE 0.9 % IV SOLN
INTRAVENOUS | Status: AC
  Administered 2011-10-17: 1000 mL via INTRAVENOUS

## 2011-10-17 MED ORDER — SODIUM CHLORIDE 0.9 % IV SOLN
20.0000 mg/m2 | Freq: Once | INTRAVENOUS | Status: AC
  Administered 2011-10-17: 44 mg via INTRAVENOUS
  Filled 2011-10-17: qty 44

## 2011-10-17 MED ORDER — SODIUM CHLORIDE 0.9 % IV SOLN
100.0000 mg/m2 | Freq: Once | INTRAVENOUS | Status: AC
  Administered 2011-10-17: 220 mg via INTRAVENOUS
  Filled 2011-10-17: qty 11

## 2011-10-17 MED ORDER — DEXAMETHASONE SODIUM PHOSPHATE 10 MG/ML IJ SOLN: 10.0000 mg | Freq: Once | INTRAMUSCULAR | Status: DC

## 2011-10-17 NOTE — Progress Notes (Signed)
[name] Conner tolerated infusions well and without incident; verbalizes understanding for follow-up.  No distress noted at time of discharge and patient was discharged home with his wife.

## 2011-10-18 ENCOUNTER — Encounter (HOSPITAL_BASED_OUTPATIENT_CLINIC_OR_DEPARTMENT_OTHER): Payer: 59

## 2011-10-18 DIAGNOSIS — Z5189 Encounter for other specified aftercare: Secondary | ICD-10-CM

## 2011-10-18 DIAGNOSIS — C7A090 Malignant carcinoid tumor of the bronchus and lung: Secondary | ICD-10-CM

## 2011-10-18 DIAGNOSIS — C7B8 Other secondary neuroendocrine tumors: Secondary | ICD-10-CM

## 2011-10-18 DIAGNOSIS — C7951 Secondary malignant neoplasm of bone: Secondary | ICD-10-CM

## 2011-10-18 DIAGNOSIS — C7A8 Other malignant neuroendocrine tumors: Secondary | ICD-10-CM

## 2011-10-18 MED ORDER — DENOSUMAB 120 MG/1.7ML ~~LOC~~ SOLN
120.0000 mg | Freq: Once | SUBCUTANEOUS | Status: AC
  Administered 2011-10-18: 120 mg via SUBCUTANEOUS
  Filled 2011-10-18: qty 1.7

## 2011-10-18 MED ORDER — HEPARIN SOD (PORK) LOCK FLUSH 100 UNIT/ML IV SOLN
500.0000 [IU] | Freq: Once | INTRAVENOUS | Status: AC
  Administered 2011-10-18: 500 [IU] via INTRAVENOUS
  Filled 2011-10-18: qty 5

## 2011-10-18 MED ORDER — HEPARIN SOD (PORK) LOCK FLUSH 100 UNIT/ML IV SOLN
INTRAVENOUS | Status: AC
  Filled 2011-10-18: qty 5

## 2011-10-18 MED ORDER — PEGFILGRASTIM INJECTION 6 MG/0.6ML
SUBCUTANEOUS | Status: AC
  Administered 2011-10-18: 6 mg via SUBCUTANEOUS
  Filled 2011-10-18: qty 0.6

## 2011-10-18 MED ORDER — SODIUM CHLORIDE 0.9 % IV SOLN
INTRAVENOUS | Status: AC
  Administered 2011-10-18 (×2): 1000 mL via INTRAVENOUS

## 2011-10-18 MED ORDER — PEGFILGRASTIM INJECTION 6 MG/0.6ML
6.0000 mg | Freq: Once | SUBCUTANEOUS | Status: AC
  Administered 2011-10-18: 6 mg via SUBCUTANEOUS

## 2011-10-18 MED ORDER — SODIUM CHLORIDE 0.9 % IJ SOLN
INTRAMUSCULAR | Status: AC
  Filled 2011-10-18: qty 10

## 2011-10-18 NOTE — Patient Instructions (Addendum)
[facility] Discharge Instructions for Patients Receiving Chemotherapy  Today you received the following chemotherapy agents cisplatin and etopiside for 3 days and fluids neulasta, and xgeva on day 4.  To help prevent nausea and vomiting after your treatment, we encourage you to take your nausea medication Zofran/ondansetron-- Take 1 tablet two times a day as needed for nausea or vomiting starting on the third day after chemotherapy. Lorazepam or ativan--Take 1 tablet (0.5 mg total) by mouth every 4 (four) hours as needed (Nausea or vomiting   You will have 21 days between this treatment and next.  We will do lab work the morning before CT scans... See the Dr. As scheduled.  With cycle  5 you will get the chemo pills CCNU and then will have 28 days between that cycle and cycle 6. We will give you the neulasta shot on the 4th day with each treatment and we will give you the xgeva every 28 days.  If you develop nausea and vomiting that is not controlled by your nausea medication, call the clinic. If it is after clinic hours your family physician or the after hours number for the clinic or go to the Emergency Department.   BELOW ARE SYMPTOMS THAT SHOULD BE REPORTED IMMEDIATELY:  *FEVER GREATER THAN 101.0 F  *CHILLS WITH OR WITHOUT FEVER  NAUSEA AND VOMITING THAT IS NOT CONTROLLED WITH YOUR NAUSEA MEDICATION  *UNUSUAL SHORTNESS OF BREATH  *UNUSUAL BRUISING OR BLEEDING  TENDERNESS IN MOUTH AND THROAT WITH OR WITHOUT PRESENCE OF ULCERS  *URINARY PROBLEMS  *BOWEL PROBLEMS  UNUSUAL RASH Items with * indicate a potential emergency and should be followed up as soon as possible.  One of the nurses will contact you 24 hours after your treatment. Please let the nurse know about any problems that you may have experienced. Feel free to call the clinic you have any questions or concerns. The clinic phone number is [phone].   I have been informed and understand all the  instructions given to me. I know to contact the clinic, my physician, or go to the Emergency Department if any problems should occur. I do not have any questions at this time, but understand that I may call the clinic during office hours or the Patient Navigator at [phone] should I have any questions or need assistance in obtaining follow up care.    __________________________________________  _____________  __________ Signature of Patient or Authorized Representative            Date                   Time    __________________________________________ Nurse's Signature

## 2011-10-18 NOTE — Progress Notes (Signed)
[name] Conner presents today for injection per MD orders. Xgeva 120 mg administered SQ in right  Abdomen. Administration without incident. Patient tolerated well.   Tolerated  well.

## 2011-10-19 ENCOUNTER — Telehealth (HOSPITAL_COMMUNITY): Payer: Self-pay

## 2011-10-19 NOTE — Telephone Encounter (Signed)
Msg left on answering machine to call with any problems, issues or questions.

## 2011-10-29 ENCOUNTER — Other Ambulatory Visit (HOSPITAL_COMMUNITY): Payer: Self-pay | Admitting: *Deleted

## 2011-10-29 ENCOUNTER — Ambulatory Visit (HOSPITAL_COMMUNITY)
Admission: RE | Admit: 2011-10-29 | Discharge: 2011-10-29 | Disposition: A | Discharge status: Home / Self Care | Payer: 59 | Source: Ambulatory Visit | Attending: Oncology | Admitting: Oncology

## 2011-10-29 ENCOUNTER — Encounter (HOSPITAL_COMMUNITY): Payer: 59

## 2011-10-29 ENCOUNTER — Telehealth (HOSPITAL_COMMUNITY): Payer: Self-pay | Admitting: *Deleted

## 2011-10-29 DIAGNOSIS — C7949 Secondary malignant neoplasm of other parts of nervous system: Secondary | ICD-10-CM | POA: Insufficient documentation

## 2011-10-29 DIAGNOSIS — I2699 Other pulmonary embolism without acute cor pulmonale: Secondary | ICD-10-CM

## 2011-10-29 DIAGNOSIS — C787 Secondary malignant neoplasm of liver and intrahepatic bile duct: Secondary | ICD-10-CM | POA: Insufficient documentation

## 2011-10-29 DIAGNOSIS — C7A8 Other malignant neuroendocrine tumors: Secondary | ICD-10-CM

## 2011-10-29 DIAGNOSIS — C3490 Malignant neoplasm of unspecified part of unspecified bronchus or lung: Secondary | ICD-10-CM | POA: Insufficient documentation

## 2011-10-29 DIAGNOSIS — C7951 Secondary malignant neoplasm of bone: Secondary | ICD-10-CM

## 2011-10-29 DIAGNOSIS — C7931 Secondary malignant neoplasm of brain: Secondary | ICD-10-CM | POA: Insufficient documentation

## 2011-10-29 LAB — DIFFERENTIAL
Basophils Relative: 1 % (ref 0–1)
Eosinophils Absolute: 0.1 10*3/uL (ref 0.0–0.7)
Neutrophils Relative %: 70 % (ref 43–77)

## 2011-10-29 LAB — CBC
MCH: 32.1 pg (ref 26.0–34.0)
MCHC: 33.8 g/dL (ref 30.0–36.0)
Platelets: 44 10*3/uL — ABNORMAL LOW (ref 150–400)
RBC: 3.58 MIL/uL — ABNORMAL LOW (ref 4.22–5.81)

## 2011-10-29 LAB — BASIC METABOLIC PANEL
Calcium: 9.3 mg/dL (ref 8.4–10.5)
GFR calc Af Amer: >90 mL/min (ref >90)
GFR calc non Af Amer: >90 mL/min (ref >90)
Glucose, Bld: 126 mg/dL — ABNORMAL HIGH (ref 70–99)
Sodium: 132 mEq/L — ABNORMAL LOW (ref 135–145)

## 2011-10-29 MED ORDER — WARFARIN SODIUM 5 MG PO TABS: 5.0000 mg | ORAL_TABLET | Freq: Every day | ORAL | Status: DC

## 2011-10-29 MED ORDER — GADOBENATE DIMEGLUMINE 529 MG/ML IV SOLN
17.0000 mL | Freq: Once | INTRAVENOUS | Status: AC | PRN
  Administered 2011-10-29: 17 mL via INTRAVENOUS

## 2011-10-29 MED ORDER — ENOXAPARIN SODIUM 150 MG/ML ~~LOC~~ SOLN
1.5000 mg/kg | Freq: Once | SUBCUTANEOUS | Status: DC
  Filled 2011-10-29: qty 1

## 2011-10-29 MED ORDER — IOHEXOL 300 MG/ML  SOLN
100.0000 mL | Freq: Once | INTRAMUSCULAR | Status: AC | PRN
  Administered 2011-10-29: 100 mL via INTRAVENOUS

## 2011-10-29 NOTE — Telephone Encounter (Signed)
Called pt to verbalize need for lovenox and coumadin based on CT findings. Pt states unable to come back today he will be here for 930 am appt. tomorrow.  Dr. Mariel Sleet notified.

## 2011-10-29 NOTE — Progress Notes (Signed)
Labs drawn today for cbc/diff,cmp 

## 2011-10-30 ENCOUNTER — Other Ambulatory Visit (HOSPITAL_COMMUNITY): Payer: 59

## 2011-10-30 ENCOUNTER — Encounter (HOSPITAL_BASED_OUTPATIENT_CLINIC_OR_DEPARTMENT_OTHER): Payer: 59 | Admitting: Oncology

## 2011-10-30 DIAGNOSIS — C7A090 Malignant carcinoid tumor of the bronchus and lung: Secondary | ICD-10-CM

## 2011-10-30 DIAGNOSIS — E2740 Unspecified adrenocortical insufficiency: Secondary | ICD-10-CM

## 2011-10-30 DIAGNOSIS — E876 Hypokalemia: Secondary | ICD-10-CM

## 2011-10-30 DIAGNOSIS — C7A8 Other malignant neuroendocrine tumors: Secondary | ICD-10-CM

## 2011-10-30 DIAGNOSIS — I2699 Other pulmonary embolism without acute cor pulmonale: Secondary | ICD-10-CM

## 2011-10-30 DIAGNOSIS — C7B8 Other secondary neuroendocrine tumors: Secondary | ICD-10-CM

## 2011-10-30 LAB — PROTIME-INR
INR: 0.99 (ref 0.00–1.49)
Prothrombin Time: 13.3 seconds (ref 11.6–15.2)

## 2011-10-30 MED ORDER — POTASSIUM CHLORIDE CRYS ER 20 MEQ PO TBCR: 20.0000 meq | EXTENDED_RELEASE_TABLET | Freq: Two times a day (BID) | ORAL | Status: DC

## 2011-10-30 MED ORDER — ENOXAPARIN SODIUM 150 MG/ML ~~LOC~~ SOLN
1.5000 mg/kg | Freq: Once | SUBCUTANEOUS | Status: AC
  Administered 2011-10-30: 130 mg via SUBCUTANEOUS
  Filled 2011-10-30: qty 1

## 2011-10-30 MED ORDER — CORTISONE ACETATE 25 MG PO TABS: 25.0000 mg | ORAL_TABLET | Freq: Two times a day (BID) | ORAL | Status: DC

## 2011-10-30 MED ORDER — HYDROCORTISONE 5 MG PO TABS: 5.0000 mg | ORAL_TABLET | Freq: Two times a day (BID) | ORAL | Status: AC

## 2011-10-30 MED ORDER — LIDOCAINE-PRILOCAINE 2.5-2.5 % EX CREA: TOPICAL_CREAM | CUTANEOUS | Status: DC | PRN

## 2011-10-30 NOTE — Progress Notes (Signed)
CC:   Kirk Ruths, M.D. Radene Gunning, M.D., Ph.D.  DIAGNOSES: 1. 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     He has had CCNU with cycle 2.  He will receive it with cycle 4 and     cycle 6 and possibly cycle 8 if we get 8 cycles in.  He is status     post radiation therapy after resection of a huge mass in the right     side of the brain that was approximately 6.5 cm.  He also had a     small left frontal lobe lesion, another right posterior bony lesion     that was starting to encroach upon the brain. 2. Tinea corpora of the lower legs, asymptomatic. 3. 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  Will switch him to     cortisone if they have the drug in, but he has been taking     approximately 25 mg b.i.d. of the hydrocortisone.  Will change him     to cortisone 25 mg in the morning and 12.5 mg in the afternoon. 4. Possible methicillin-resistant Staphylococcus aureus of the groin     on the right and left, and left hand/wrist area which had been     treated with Septra DS b.i.d. with resolution. 5. Candidiasis of the oral mucosa treated Diflucan which also helped     the tinea corpora of the lower legs.  Lathyn is here today to go over his recent x-rays which included an MRI of the brain and his CT scan.  His CT scan showed tremendous improvement with about 50-75% reduction. An occasional liver lesion that was small has disappeared completely. The MRI shows the brain lesions to be either not visible or markedly smaller and certainly well controlled presently.  He looks better and feels better.  His vital signs are quite good.  He does remain mildly hypokalemic, so he needs to go back on the potassium, namely Klor-Con 20 mEq b.i.d. rather than  just once a day.  His appetite is really coming back.  He states he can taste food better.  Since he has never had a seizure and is certainly oriented and walking quite well except for possible minimal instability for which he uses a cane or walking stick, I do believe he could drive.  I do not think there is a restriction now since he has been seizure-free, mainly never having had a seizure.  His vital signs today are quite stable.  He looks very good.  So we had a long discussion.  We looked at the films together with he and his wife.  We are delighted with the results, but we need to keep going with the same therapy.  We are going to aim for 6 cycles and possibly 8. After cycle 6, will re-scan him with an MRI of the brain and a CT.  If we are still responding quite nicely, we will go with 2 more cycles, or if he has had a CR by cycle 6, will go with 2 more cycles, but will limit it to 8 cycles I suspect.  I do not know if we will get to 8 cycles if he has hematologic toxicity, but will see.  We  will see him back in 4 weeks.    ______________________________ Ladona Horns. Mariel Sleet, MD ESN/MEDQ  D:  10/30/2011  T:  10/30/2011  Job:  [id number]

## 2011-10-30 NOTE — Progress Notes (Signed)
This office note has been dictated.

## 2011-10-31 ENCOUNTER — Telehealth (HOSPITAL_COMMUNITY): Payer: Self-pay | Admitting: Oncology

## 2011-10-31 ENCOUNTER — Encounter (HOSPITAL_BASED_OUTPATIENT_CLINIC_OR_DEPARTMENT_OTHER): Payer: 59

## 2011-10-31 VITALS — BP 100/65 | HR 88 | Ht 74.0 in | Wt 190.0 lb

## 2011-10-31 DIAGNOSIS — I2699 Other pulmonary embolism without acute cor pulmonale: Secondary | ICD-10-CM

## 2011-10-31 MED ORDER — ENOXAPARIN SODIUM 150 MG/ML ~~LOC~~ SOLN
1.5000 mg/kg | Freq: Once | SUBCUTANEOUS | Status: AC
  Administered 2011-10-31: 130 mg via SUBCUTANEOUS
  Filled 2011-10-31: qty 1

## 2011-10-31 NOTE — Progress Notes (Signed)
[name] Conner presents today for injection per MD orders. lovenox 130 mg administered SQ in right Abdomen. Administration without incident. Patient tolerated well. Pt and wife expressed wishes to take lovenox at home. Educated wife on subcutaneous injection and called med into [facility] [place].

## 2011-11-02 ENCOUNTER — Telehealth (HOSPITAL_COMMUNITY): Payer: Self-pay | Admitting: *Deleted

## 2011-11-02 ENCOUNTER — Other Ambulatory Visit (HOSPITAL_COMMUNITY): Payer: Self-pay | Admitting: *Deleted

## 2011-11-02 DIAGNOSIS — C7A8 Other malignant neuroendocrine tumors: Secondary | ICD-10-CM

## 2011-11-02 NOTE — Telephone Encounter (Signed)
Spoke with pt states he has not had any changes to his Keppra.

## 2011-11-02 NOTE — Telephone Encounter (Signed)
Message copied by Dennie Maizes on Fri Nov 02, 2011  9:36 AM ------      Message from: Mariel Sleet, ERIC S      Created: Fri Nov 02, 2011  9:20 AM       Call his wife to verify dose and any changes a month ago?

## 2011-11-02 NOTE — Telephone Encounter (Signed)
Message copied by Dennie Maizes on Fri Nov 02, 2011 10:18 AM ------      Message from: Mariel Sleet, ERIC S      Created: Fri Nov 02, 2011 10:05 AM       Ask his wife to decrease to 500 mg BID on Sat and Sun only      Other days, same dose as is!!      Put him down for repeat dose in 4-6 weeks.

## 2011-11-02 NOTE — Telephone Encounter (Signed)
Spoke with [name] Conner.

## 2011-11-02 NOTE — Telephone Encounter (Addendum)
Message copied by Dennie Maizes on Fri Nov 02, 2011 10:23 AM ------      Message from: Mariel Sleet, ERIC S      Created: Fri Nov 02, 2011 10:05 AM       Ask his wife to decrease to 500 mg BID on Sat and Sun only      Other days, same dose as is!!      Put him down for repeat dose in 4-6 weeks.

## 2011-11-05 ENCOUNTER — Other Ambulatory Visit (HOSPITAL_COMMUNITY): Payer: 59

## 2011-11-05 ENCOUNTER — Encounter (HOSPITAL_COMMUNITY): Payer: 59 | Attending: Oncology

## 2011-11-05 DIAGNOSIS — C7A8 Other malignant neuroendocrine tumors: Secondary | ICD-10-CM

## 2011-11-05 DIAGNOSIS — I2699 Other pulmonary embolism without acute cor pulmonale: Secondary | ICD-10-CM | POA: Insufficient documentation

## 2011-11-05 DIAGNOSIS — C801 Malignant (primary) neoplasm, unspecified: Secondary | ICD-10-CM | POA: Insufficient documentation

## 2011-11-05 DIAGNOSIS — C7952 Secondary malignant neoplasm of bone marrow: Secondary | ICD-10-CM | POA: Insufficient documentation

## 2011-11-05 DIAGNOSIS — C7951 Secondary malignant neoplasm of bone: Secondary | ICD-10-CM | POA: Insufficient documentation

## 2011-11-05 DIAGNOSIS — C7B8 Other secondary neuroendocrine tumors: Secondary | ICD-10-CM

## 2011-11-05 DIAGNOSIS — Z5111 Encounter for antineoplastic chemotherapy: Secondary | ICD-10-CM

## 2011-11-05 DIAGNOSIS — C7A090 Malignant carcinoid tumor of the bronchus and lung: Secondary | ICD-10-CM

## 2011-11-05 LAB — DIFFERENTIAL
Basophils Absolute: 0 10*3/uL (ref 0.0–0.1)
Eosinophils Relative: 1 % (ref 0–5)
Lymphocytes Relative: 22 % (ref 12–46)
Lymphs Abs: 1 10*3/uL (ref 0.7–4.0)
Monocytes Absolute: 0.5 10*3/uL (ref 0.1–1.0)
Monocytes Relative: 10 % (ref 3–12)
Neutro Abs: 3 10*3/uL (ref 1.7–7.7)

## 2011-11-05 LAB — COMPREHENSIVE METABOLIC PANEL
ALT: 19 U/L (ref 0–53)
AST: 14 U/L (ref 0–37)
Albumin: 3.4 g/dL — ABNORMAL LOW (ref 3.5–5.2)
Calcium: 9.6 mg/dL (ref 8.4–10.5)
Creatinine, Ser: 0.75 mg/dL (ref 0.50–1.35)
Sodium: 133 mEq/L — ABNORMAL LOW (ref 135–145)
Total Protein: 6.8 g/dL (ref 6.0–8.3)

## 2011-11-05 LAB — CBC
HCT: 32.6 % — ABNORMAL LOW (ref 39.0–52.0)
Hemoglobin: 11 g/dL — ABNORMAL LOW (ref 13.0–17.0)
MCHC: 33.7 g/dL (ref 30.0–36.0)
MCV: 95 fL (ref 78.0–100.0)

## 2011-11-05 LAB — PROTIME-INR
INR: 1.4 (ref 0.00–1.49)
Prothrombin Time: 17.4 seconds — ABNORMAL HIGH (ref 11.6–15.2)

## 2011-11-05 MED ORDER — SODIUM CHLORIDE 0.9 % IV SOLN
100.0000 mg/m2 | Freq: Once | INTRAVENOUS | Status: AC
  Administered 2011-11-05: 220 mg via INTRAVENOUS
  Filled 2011-11-05: qty 11

## 2011-11-05 MED ORDER — HEPARIN SOD (PORK) LOCK FLUSH 100 UNIT/ML IV SOLN
500.0000 [IU] | Freq: Once | INTRAVENOUS | Status: DC | PRN
  Filled 2011-11-05: qty 5

## 2011-11-05 MED ORDER — PALONOSETRON HCL INJECTION 0.25 MG/5ML
INTRAVENOUS | Status: AC
  Filled 2011-11-05: qty 5

## 2011-11-05 MED ORDER — SODIUM CHLORIDE 0.9 % IV SOLN: 150.0000 mg | Freq: Once | INTRAVENOUS | Status: DC

## 2011-11-05 MED ORDER — DEXAMETHASONE SODIUM PHOSPHATE 4 MG/ML IJ SOLN: 12.0000 mg | Freq: Once | INTRAMUSCULAR | Status: DC

## 2011-11-05 MED ORDER — SODIUM CHLORIDE 0.9 % IV SOLN
20.0000 mg/m2 | Freq: Once | INTRAVENOUS | Status: AC
  Administered 2011-11-05: 44 mg via INTRAVENOUS
  Filled 2011-11-05: qty 44

## 2011-11-05 MED ORDER — SODIUM CHLORIDE 0.9 % IV SOLN
INTRAVENOUS | Status: AC
  Administered 2011-11-05: 10:00:00 via INTRAVENOUS

## 2011-11-05 MED ORDER — SODIUM CHLORIDE 0.9 % IV SOLN
Freq: Once | INTRAVENOUS | Status: AC
  Administered 2011-11-05: 10:00:00 via INTRAVENOUS
  Filled 2011-11-05: qty 5

## 2011-11-05 MED ORDER — SODIUM CHLORIDE 0.9 % IJ SOLN
10.0000 mL | INTRAMUSCULAR | Status: DC | PRN
  Administered 2011-11-05: 10 mL
  Filled 2011-11-05: qty 10

## 2011-11-05 MED ORDER — PALONOSETRON HCL INJECTION 0.25 MG/5ML
0.2500 mg | Freq: Once | INTRAVENOUS | Status: AC
  Administered 2011-11-05: 0.25 mg via INTRAVENOUS

## 2011-11-05 MED ORDER — LOMUSTINE 40 MG PO CAPS
80.0000 mg | ORAL_CAPSULE | Freq: Once | ORAL | Status: AC
  Administered 2011-11-05: 80 mg via ORAL
  Filled 2011-11-05: qty 2

## 2011-11-05 MED ORDER — HEPARIN SOD (PORK) LOCK FLUSH 100 UNIT/ML IV SOLN
INTRAVENOUS | Status: AC
  Filled 2011-11-05: qty 5

## 2011-11-06 ENCOUNTER — Encounter (HOSPITAL_BASED_OUTPATIENT_CLINIC_OR_DEPARTMENT_OTHER): Payer: 59

## 2011-11-06 DIAGNOSIS — C7A8 Other malignant neuroendocrine tumors: Secondary | ICD-10-CM

## 2011-11-06 DIAGNOSIS — C7B8 Other secondary neuroendocrine tumors: Secondary | ICD-10-CM

## 2011-11-06 DIAGNOSIS — C7951 Secondary malignant neoplasm of bone: Secondary | ICD-10-CM

## 2011-11-06 DIAGNOSIS — C7A090 Malignant carcinoid tumor of the bronchus and lung: Secondary | ICD-10-CM

## 2011-11-06 DIAGNOSIS — Z5111 Encounter for antineoplastic chemotherapy: Secondary | ICD-10-CM

## 2011-11-06 MED ORDER — HEPARIN SOD (PORK) LOCK FLUSH 100 UNIT/ML IV SOLN
500.0000 [IU] | Freq: Once | INTRAVENOUS | Status: AC | PRN
  Administered 2011-11-06: 500 [IU]
  Filled 2011-11-06: qty 5

## 2011-11-06 MED ORDER — SODIUM CHLORIDE 0.9 % IJ SOLN
10.0000 mL | INTRAMUSCULAR | Status: DC | PRN
  Administered 2011-11-06: 10 mL
  Filled 2011-11-06: qty 10

## 2011-11-06 MED ORDER — HEPARIN SOD (PORK) LOCK FLUSH 100 UNIT/ML IV SOLN
INTRAVENOUS | Status: AC
  Filled 2011-11-06: qty 5

## 2011-11-06 MED ORDER — SODIUM CHLORIDE 0.9 % IV SOLN
INTRAVENOUS | Status: AC
  Administered 2011-11-06: 2000 mL via INTRAVENOUS

## 2011-11-06 MED ORDER — SODIUM CHLORIDE 0.9 % IV SOLN
20.0000 mg/m2 | Freq: Once | INTRAVENOUS | Status: AC
  Administered 2011-11-06: 44 mg via INTRAVENOUS
  Filled 2011-11-06: qty 44

## 2011-11-06 MED ORDER — SODIUM CHLORIDE 0.9 % IV SOLN
10.0000 mg | Freq: Once | INTRAVENOUS | Status: AC
  Administered 2011-11-06: 10 mg via INTRAVENOUS
  Filled 2011-11-06: qty 1

## 2011-11-06 MED ORDER — DEXAMETHASONE SODIUM PHOSPHATE 10 MG/ML IJ SOLN: 10.0000 mg | Freq: Once | INTRAMUSCULAR | Status: DC

## 2011-11-06 MED ORDER — SODIUM CHLORIDE 0.9 % IV SOLN
100.0000 mg/m2 | Freq: Once | INTRAVENOUS | Status: AC
  Administered 2011-11-06: 220 mg via INTRAVENOUS
  Filled 2011-11-06: qty 11

## 2011-11-06 MED ORDER — SODIUM CHLORIDE 0.9 % IJ SOLN
INTRAMUSCULAR | Status: AC
  Filled 2011-11-06: qty 10

## 2011-11-06 NOTE — Progress Notes (Signed)
Tolerated chemo and fluids well today.

## 2011-11-07 ENCOUNTER — Encounter (HOSPITAL_BASED_OUTPATIENT_CLINIC_OR_DEPARTMENT_OTHER): Payer: 59

## 2011-11-07 DIAGNOSIS — C7A090 Malignant carcinoid tumor of the bronchus and lung: Secondary | ICD-10-CM

## 2011-11-07 DIAGNOSIS — C7B8 Other secondary neuroendocrine tumors: Secondary | ICD-10-CM

## 2011-11-07 DIAGNOSIS — C7A8 Other malignant neuroendocrine tumors: Secondary | ICD-10-CM

## 2011-11-07 DIAGNOSIS — Z5111 Encounter for antineoplastic chemotherapy: Secondary | ICD-10-CM

## 2011-11-07 DIAGNOSIS — C7951 Secondary malignant neoplasm of bone: Secondary | ICD-10-CM

## 2011-11-07 MED ORDER — DEXAMETHASONE SODIUM PHOSPHATE 10 MG/ML IJ SOLN
Freq: Once | INTRAMUSCULAR | Status: AC
  Administered 2011-11-07: 10 mg via INTRAVENOUS
  Filled 2011-11-07: qty 1

## 2011-11-07 MED ORDER — SODIUM CHLORIDE 0.9 % IV SOLN
INTRAVENOUS | Status: AC
  Administered 2011-11-07: 10:00:00 via INTRAVENOUS

## 2011-11-07 MED ORDER — HEPARIN SOD (PORK) LOCK FLUSH 100 UNIT/ML IV SOLN
INTRAVENOUS | Status: AC
  Administered 2011-11-07: 500 [IU]
  Filled 2011-11-07: qty 5

## 2011-11-07 MED ORDER — ETOPOSIDE CHEMO INJECTION 20 MG/ML
100.0000 mg/m2 | Freq: Once | INTRAVENOUS | Status: AC
  Administered 2011-11-07: 220 mg via INTRAVENOUS
  Filled 2011-11-07: qty 11

## 2011-11-07 MED ORDER — SODIUM CHLORIDE 0.9 % IV SOLN
20.0000 mg/m2 | Freq: Once | INTRAVENOUS | Status: AC
  Administered 2011-11-07: 44 mg via INTRAVENOUS
  Filled 2011-11-07: qty 44

## 2011-11-07 MED ORDER — HEPARIN SOD (PORK) LOCK FLUSH 100 UNIT/ML IV SOLN
500.0000 [IU] | Freq: Once | INTRAVENOUS | Status: AC | PRN
  Administered 2011-11-07: 500 [IU]
  Filled 2011-11-07: qty 5

## 2011-11-07 MED ORDER — SODIUM CHLORIDE 0.9 % IJ SOLN
INTRAMUSCULAR | Status: AC
  Administered 2011-11-07: 10 mL
  Filled 2011-11-07: qty 10

## 2011-11-07 MED ORDER — FAMOTIDINE IN NACL 20-0.9 MG/50ML-% IV SOLN
20.0000 mg | Freq: Once | INTRAVENOUS | Status: AC
  Administered 2011-11-07: 20 mg via INTRAVENOUS

## 2011-11-07 MED ORDER — FAMOTIDINE IN NACL 20-0.9 MG/50ML-% IV SOLN
INTRAVENOUS | Status: AC
  Filled 2011-11-07: qty 50

## 2011-11-07 MED ORDER — SODIUM CHLORIDE 0.9 % IJ SOLN
10.0000 mL | INTRAMUSCULAR | Status: DC | PRN
  Administered 2011-11-07: 10 mL
  Filled 2011-11-07: qty 10

## 2011-11-07 MED ORDER — DEXAMETHASONE SODIUM PHOSPHATE 10 MG/ML IJ SOLN: 10.0000 mg | Freq: Once | INTRAMUSCULAR | Status: DC

## 2011-11-08 ENCOUNTER — Encounter (HOSPITAL_BASED_OUTPATIENT_CLINIC_OR_DEPARTMENT_OTHER): Payer: 59

## 2011-11-08 DIAGNOSIS — C7951 Secondary malignant neoplasm of bone: Secondary | ICD-10-CM

## 2011-11-08 DIAGNOSIS — C7A090 Malignant carcinoid tumor of the bronchus and lung: Secondary | ICD-10-CM

## 2011-11-08 DIAGNOSIS — C7A8 Other malignant neuroendocrine tumors: Secondary | ICD-10-CM

## 2011-11-08 DIAGNOSIS — Z5189 Encounter for other specified aftercare: Secondary | ICD-10-CM

## 2011-11-08 DIAGNOSIS — C7B8 Other secondary neuroendocrine tumors: Secondary | ICD-10-CM

## 2011-11-08 MED ORDER — PEGFILGRASTIM INJECTION 6 MG/0.6ML
SUBCUTANEOUS | Status: AC
  Administered 2011-11-08: 6 mg via SUBCUTANEOUS
  Filled 2011-11-08: qty 0.6

## 2011-11-08 MED ORDER — SODIUM CHLORIDE 0.9 % IV SOLN
INTRAVENOUS | Status: DC
Start: 2011-11-08 — End: 2011-11-08
  Administered 2011-11-08: 1000 mL via INTRAVENOUS

## 2011-11-08 MED ORDER — PEGFILGRASTIM INJECTION 6 MG/0.6ML
6.0000 mg | Freq: Once | SUBCUTANEOUS | Status: AC
  Administered 2011-11-08: 6 mg via SUBCUTANEOUS

## 2011-11-08 MED ORDER — HEPARIN SOD (PORK) LOCK FLUSH 100 UNIT/ML IV SOLN
INTRAVENOUS | Status: AC
  Administered 2011-11-08: 500 [IU] via INTRAVENOUS
  Filled 2011-11-08: qty 5

## 2011-11-08 MED ORDER — SODIUM CHLORIDE 0.9 % IJ SOLN
INTRAMUSCULAR | Status: AC
  Administered 2011-11-08: 10 mL via INTRAVENOUS
  Filled 2011-11-08: qty 10

## 2011-11-08 NOTE — Progress Notes (Signed)
[name] Conner tolerated normal saline infusion well and without incident; verbalizes understanding for follow-up tomorrow for repeat pt/inr.  No distress noted at time of discharge and patient was discharged home with himself.  [name] Conner also reported thrush symptoms and states he took diflucan with improvement in symptoms.  Diflucan 100mg  2 tabs today then 1 tab daily #20 called into [facility] per T. Kefalas, PA-C, patient is aware the medication was called in for him.

## 2011-11-09 ENCOUNTER — Other Ambulatory Visit (HOSPITAL_COMMUNITY): Payer: 59

## 2011-11-12 ENCOUNTER — Inpatient Hospital Stay (HOSPITAL_COMMUNITY): Payer: 59

## 2011-11-12 ENCOUNTER — Encounter (HOSPITAL_BASED_OUTPATIENT_CLINIC_OR_DEPARTMENT_OTHER): Payer: 59

## 2011-11-12 DIAGNOSIS — I2699 Other pulmonary embolism without acute cor pulmonale: Secondary | ICD-10-CM

## 2011-11-12 MED ORDER — WARFARIN SODIUM 1 MG PO TABS: ORAL_TABLET | ORAL | Status: DC

## 2011-11-12 NOTE — Progress Notes (Signed)
Labs drawn today for pt. Patient on 5mg  of coud. And lovenox.  Call patient at [phone]

## 2011-11-13 ENCOUNTER — Inpatient Hospital Stay (HOSPITAL_COMMUNITY): Payer: 59

## 2011-11-14 ENCOUNTER — Inpatient Hospital Stay (HOSPITAL_COMMUNITY): Payer: 59

## 2011-11-15 ENCOUNTER — Encounter (HOSPITAL_BASED_OUTPATIENT_CLINIC_OR_DEPARTMENT_OTHER): Payer: 59

## 2011-11-15 ENCOUNTER — Ambulatory Visit (HOSPITAL_COMMUNITY): Payer: 59

## 2011-11-15 DIAGNOSIS — C7A090 Malignant carcinoid tumor of the bronchus and lung: Secondary | ICD-10-CM

## 2011-11-15 DIAGNOSIS — C7951 Secondary malignant neoplasm of bone: Secondary | ICD-10-CM

## 2011-11-15 DIAGNOSIS — C7B8 Other secondary neuroendocrine tumors: Secondary | ICD-10-CM

## 2011-11-15 MED ORDER — DENOSUMAB 120 MG/1.7ML ~~LOC~~ SOLN
120.0000 mg | Freq: Once | SUBCUTANEOUS | Status: AC
  Administered 2011-11-15: 120 mg via SUBCUTANEOUS
  Filled 2011-11-15: qty 1.7

## 2011-11-15 NOTE — Progress Notes (Signed)
[name] Conner presents today for injection per the provider's orders.  Xgeva administered administration without incident; see MAR for injection details.  Patient tolerated procedure well and without incident.  No questions or complaints noted at this time.

## 2011-11-15 NOTE — Progress Notes (Signed)
Patients treatment plan dates corrected. He received CCNU with cycle 4 so cycle 5 planned 28 days from that date on April 1st. Cycle 6 will be 21 days from April 1st since no ccnu on cycle 5.

## 2011-11-16 ENCOUNTER — Encounter (HOSPITAL_BASED_OUTPATIENT_CLINIC_OR_DEPARTMENT_OTHER): Payer: 59

## 2011-11-16 ENCOUNTER — Other Ambulatory Visit (HOSPITAL_COMMUNITY): Payer: Self-pay

## 2011-11-16 DIAGNOSIS — I2699 Other pulmonary embolism without acute cor pulmonale: Secondary | ICD-10-CM

## 2011-11-20 ENCOUNTER — Encounter (HOSPITAL_BASED_OUTPATIENT_CLINIC_OR_DEPARTMENT_OTHER): Payer: 59 | Admitting: Oncology

## 2011-11-20 VITALS — BP 115/76 | HR 80 | Temp 97.5°F | Ht 74.0 in | Wt 198.2 lb

## 2011-11-20 DIAGNOSIS — C7951 Secondary malignant neoplasm of bone: Secondary | ICD-10-CM

## 2011-11-20 DIAGNOSIS — C7A8 Other malignant neuroendocrine tumors: Secondary | ICD-10-CM

## 2011-11-20 DIAGNOSIS — C7B8 Other secondary neuroendocrine tumors: Secondary | ICD-10-CM

## 2011-11-20 DIAGNOSIS — C7A090 Malignant carcinoid tumor of the bronchus and lung: Secondary | ICD-10-CM

## 2011-11-20 MED ORDER — FLUCONAZOLE 100 MG PO TABS: 100.0000 mg | ORAL_TABLET | Freq: Every day | ORAL | Status: DC

## 2011-11-20 NOTE — Patient Instructions (Signed)
[name] Conner  [id number] 1961/08/27  [facility]  Discharge Instructions  RECOMMENDATIONS MADE BY THE CONSULTANT AND ANY TEST RESULTS WILL BE SENT TO YOUR REFERRING DOCTOR.   EXAM FINDINGS BY MD TODAY AND SIGNS AND SYMPTOMS TO REPORT TO CLINIC OR PRIMARY MD: Doing well at there present.  Continue with therapy with no changes.  MEDICATIONS PRESCRIBED: diflucan Follow label directions  INSTRUCTIONS GIVEN AND DISCUSSED: Other :  Report fevers, chills, shortness of breath, uncontrolled nausea and vomiting, etc.  SPECIAL INSTRUCTIONS/FOLLOW-UP: Return to Clinic as scheduled for chemotherapy and labs and to see PA in 1 month.   I acknowledge that I have been informed and understand all the instructions given to me and received a copy. I do not have any more questions at this time, but understand that I may call the Specialty Clinic at [facility] at [phone] during business hours should I have any further questions or need assistance in obtaining follow-up care.    __________________________________________  _____________  __________ Signature of Patient or Authorized Representative            Date                   Time    __________________________________________ Nurse's Signature

## 2011-11-20 NOTE — Progress Notes (Signed)
Kirk Ruths, MD, MD [address] [place] [address]  1. Bone metastases  fluconazole (DIFLUCAN) 100 MG tablet, CBC, Differential, Comprehensive metabolic panel, CBC, Differential  2. Neuroendocrine cancer  fluconazole (DIFLUCAN) 100 MG tablet, CBC, Differential, Comprehensive metabolic panel, CBC, Differential    CURRENT THERAPY:S/P 4 cycles of chemotherapy with cisplatin based regimen including VP-16 and CCNU.  He has had CCNU with cycle 2 and 4.  He will receive it with cycles 6 and possibly cycle 8 if we get 8 cycles in. He is status  post radiation therapy after resection of a huge mass in the right side of the brain that was approximately 6.5 cm.   INTERVAL HISTORY: BOLESLAW BORGHI 51 y.o. 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  The patient reports she is doing very well. He does admit to some nausea that is well-controlled with his antiemetics he currently has at home. The patient also notes that occasionally he'll have a "white spot" in his mouth for which he takes Diflucan for which resolves this issue. He denies any soreness of the mouth or sore throat presently.   Spent some time discussing his future therapeutic treatment. On April 1 you'll come in for cycle 5 of chemotherapy which will not have CCNU involved. Following cycle 6, we will perform restaging CT scans. Pending these results, I warned the patient that we may recommend further treatment with 1-3 more cycles of chemotherapy. He is aware of this.  I've encouraged the patient to begin going outdoors more frequently as the weather begins to improve.  This will help lift his spirits and mood. I've encouraged him to continue good hand hygiene and avoiding sick contacts.  ROS: No TIA's or unusual headaches, no dysphagia.   No prolonged cough. No dyspnea or chest pain on exertion.  No abdominal pain, change in bowel habits, black or bloody stools.  No urinary tract symptoms.   The patient asks how long he will need to take Coumadin. I've explained to him that he will require anticoagulation for pulmonary embolism between 6 and 12 months.  Past Medical History  Diagnosis Date  . Renal disorder     kidney stones  . Bone metastases 07/19/2011  . Neuroendocrine cancer     likely lung primary    has Bone metastases and Neuroendocrine cancer on his problem list.      has no known allergies.  Mr. Debski had no medications administered during this visit.  Past Surgical History  Procedure Date  . Kidney surgery 2010    rattach ureter after kidney stones  . Cardiac catheterization 2011  . Brain surgery nov 2012    tumor removed  . Lung biopsy   . Craniotomy for tumor 07/2011  . Porta-a-cath insertion 07/23/11   . Powerport   . Portacath placement 07/23/2011    Procedure: INSERTION PORT-A-CATH;  Surgeon: Dalia Heading;  Location: [facility];  Service: General;  Laterality: Left;  Power Port (Left chest) attached to 70F catheter in Left Subclavian    Denies any headaches, dizziness, double vision, fevers, chills, night sweats, nausea, vomiting, diarrhea, constipation, chest pain, heart palpitations, shortness of breath, blood in stool, black tarry stool, urinary pain, urinary burning, urinary frequency, hematuria.   PHYSICAL EXAMINATION  ECOG PERFORMANCE STATUS: 1 - Symptomatic but completely ambulatory  Filed Vitals:   11/20/11 1130  BP: 115/76  Pulse: 80  Temp: 97.5 F (36.4 C)    GENERAL:alert, no distress, well nourished, well developed, comfortable, cooperative and smiling SKIN: skin color, texture, turgor are normal, no rashes or significant lesions HEAD: Normocephalic, No masses, lesions, tenderness or abnormalities, right parietal surgical scar noted, it is well healed. EYES: normal, PERRLA,  EOMI, Conjunctiva are pink and non-injected EARS: External ears normal OROPHARYNX:lips, buccal mucosa, and tongue normal and mucous membranes are moist, poor dentition NECK: supple, no adenopathy, no JVD, thyroid normal size, non-tender, without nodularity, no stridor, non-tender, trachea midline LYMPH:  no palpable lymphadenopathy, no hepatosplenomegaly BREAST:not examined LUNGS: clear to auscultation and percussion, diminished breath sounds throughout. HEART: regular rate & rhythm, no murmurs, no gallops, S1 normal and S2 normal ABDOMEN:abdomen soft, non-tender, normal bowel sounds, no masses or organomegaly and no hepatosplenomegaly BACK: Back symmetric, no curvature., No CVA tenderness EXTREMITIES:less then 2 second capillary refill, no joint deformities, effusion, or inflammation, no edema, no skin discoloration, no clubbing, no cyanosis  NEURO: alert & oriented x 3 with fluent speech, no focal motor/sensory deficits, gait normal    LABORATORY DATA: CBC    Component Value Date/Time   WBC 4.5 11/05/2011 0833   RBC 3.43* 11/05/2011 0833   HGB 11.0* 11/05/2011 0833   HCT 32.6* 11/05/2011 0833   PLT 130* 11/05/2011 0833   MCV 95.0 11/05/2011 0833   MCH 32.1 11/05/2011 0833   MCHC 33.7 11/05/2011 0833   RDW 17.6* 11/05/2011 0833   LYMPHSABS 1.0 11/05/2011 0833   MONOABS 0.5 11/05/2011 0833   EOSABS 0.1 11/05/2011 0833   BASOSABS 0.0 11/05/2011 0833      Chemistry      Component Value Date/Time   NA 133* 11/05/2011 0833   K 3.8 11/05/2011 0833   CL 100 11/05/2011 0833   CO2 24 11/05/2011 0833   BUN 8 11/05/2011 0833   CREATININE 0.75 11/05/2011 0833      Component Value Date/Time   CALCIUM 9.6 11/05/2011 0833   ALKPHOS 75 11/05/2011 0833   AST 14 11/05/2011 0833   ALT 19 11/05/2011 0833   BILITOT 0.3 11/05/2011 0833        RADIOGRAPHIC STUDIES:  10/29/2011  *RADIOLOGY REPORT*  Clinical Data: Metastatic lung cancer status post three cycles  chemotherapy.  CT CHEST, ABDOMEN AND PELVIS WITH CONTRAST    Technique: Multidetector CT imaging of the chest, abdomen and  pelvis was performed following the standard protocol during bolus  administration of intravenous contrast.  Contrast: OMNIPAQUE IOHEXOL 300 MG/ML IV SOLN  Comparison: CT 06/30/2011  CT CHEST  Findings: There is a port in the left anterior chest wall. No  axillary or supraclavicular lymphadenopathy. Right hilar  lymphadenopathy is decreased in the interval as well as the right  paratracheal adenopathy. For example right hilar node measures 16  mm (image 37) compared to 32 mm on prior.  Within the right lower lobe, the previously seen 19 mm pulmonary  nodule is reduced significantly in size and there is only linear  thickening in the right lower lobe. The right middle lobe  pulmonary nodule is decreased in size measuring 5 mm compared to 9  mm on prior. No new pulmonary nodules are present. There is a  focus of peripheral nodular thickening in the left lower lobe  (image 49) which is new from prior. This potentially could  represent a small infarction. There is extensive bronchiectasis in  the lower lobes  as well as central lobular emphysema in the upper  lobes.  There is a tubular filling defect within the segmental branches of  the right lower lobe pulmonary arteries well as the anterior  segment of the left lower lobe (images 38 and 43, coronal image  54). These findings are consistent with the thromboemboli.  IMPRESSION:  1. Reduction in the mediastinal and right hilar lymphadenopathy.  2. Reduction in size of pulmonary nodules.  3. Thromboemboli within the segmental branches of the left and  right lower lobe. Differential includes chronic pulmonary embolism  versus acute pulmonary embolism.  CT ABDOMEN AND PELVIS  Findings: Hepatic metastasis are decreased in size compared to  prior CT exam. For example, 15 mm metastasis in the superior  right hepatic lobe is decreased from 26 mm on prior. 14 mm lesion  in  the medial left hepatic lobe (image 60) is decreased from 17 mm  on prior. No new hepatic lesions are present.  The pancreas, spleen, and adrenal glands are normal. The right  kidney is normal. There is a large extrarenal pelvis on the left  which is similar to prior. Abdominal aorta is normal caliber. No  retroperitoneal or periportal lymphadenopathy.  No free fluid the pelvis. No pelvic lymphadenopathy.  IMPRESSION:  1. Decrease in size of hepatic metastasis.  2. No new metastasis in the abdomen pelvis.  Findings discussed with Dr. Mariel Sleet on 10/29/2011 at 1600 hours  Original Report Authenticated By: Genevive Bi, M.D.    PATHOLOGY: 07/06/2011  Diagnosis  1. Soft tissue mass, biopsy, Epidural tumor with intracranial extension  - METASTATIC POORLY DIFFERENTIATED CARCINOMA WITH NEUROENDOCRINE  DIFFERENTIATION.  - SEE COMMENT.  2. Soft tissue mass, simple excision  - METASTATIC POORLY DIFFERENTIATED CARCINOMA WITH NEUROENDOCRINE  DIFFERENTIATION.  - SEE COMMENT.  3. Dura mater  - METASTATIC POORLY DIFFERENTIATED CARCINOMA WITH NEUROENDOCRINE  DIFFERENTIATION.  - SEE COMMENT.  Microscopic Comment  1. , 2. and 3. By immunohistochemistry, the malignant cells are positive for CD56, synaptophysin,  chromogranin, cytokeratin 7, and faintly positive for neuron specific enolase, as well as TTF-1. They are  negative for S100, cytokeratin 20, cytokeratin 5/6, GFAP, Melan-A, Napsin, and p63. Overall, the findings  are consistent with metastatic poorly differentiated carcinoma with neuroendocrine features and favor  metastatic large cell neuroendocrine carcinoma. Dr.Bassam Smir has reviewed the case and is in essential  agreement with this interpretation. (JK:jy) 07/10/11  Pecola Leisure MD  Pathologist, Electronic Signature  (Case signed 07/10/2011)    ASSESSMENT:  1. Metastatic neuroendocrine carcinoma, consistent with a poorly differentiated process from the lung, namely a large  cell neuroendocrine cancer. 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 That MRI first saw this on 07/01/2011. The intracranial extension from the right lateral calvarium was 58 x 55 x 64 mm. He also appeared to have a parietal bone mass with intracranial extension as well. He had a C2 vertebral body metastasis. He also has a femur metastasis, presently asymptomatic from that at this time. His CT scan of the chest and abdomen revealed a right lower lobe mass 4.1 x 3.6 cm with also a satellite nodule in the right lower lobe of 2.0 x 1.8 cm. He had emphysematous changes and he also had metastatic disease to the hilum and mediastinum. His CT of the abdomen revealed at least 7 liver metastases, lymphadenopathy in the porta hepatis,  a nodule involving the right adrenal gland that was indeterminate but felt to be most likely metastatic disease. He has had radiation therapy following his surgical resection of the brain and skull lesion. He has had 4 full cycles now of cisplatin, VP-16, and CCNU (every other cycle) chemotherapy. His most recent restaging CT scan revealed impressive improvement. 2. Tinea corpora, especially of the lower legs.  3. Possible methicillin-resistant Staphylococcus aureus of the groin on the right and left and left hand and wrist area. Treated with Septra DS, resolved. 4. Candidiasis of the oral mucosa treated with Diflucan successfully in the past, resolved.    PLAN:  1. Pre-chemo lab work ordered as a standing order for the next two rounds of chemotherapy.  2. Added a CBC diff to his 3/25 lab appointment since he got CCNU on his last cycle.  3. I personally reviewed and went over laboratory results with the patient. 4. Return for PT/INR as scheduled 5. Patient education regarding a PE.  This will require 6-12 months of anticoagulation. 6. Patient  education regarding future treatment.  We will perform Restaging CTs following cycle 6 of chemotherapy.  He knows that we may add a few more cycles of chemotherapy following cycle 6 if deemed necessary. 7. Rx for Diflucan provided. This Rx was e-scribed #30 with 1 refill.  8. Encouraged the patient to go outdoors when the weather improves.  This will be beneficial for his mood and spirits.  9. Return for follow-up in 1 month.  Will order and schedule restaging CTs at that point in time.   All questions were answered. The patient knows to call the clinic with any problems, questions or concerns. We can certainly see the patient much sooner if necessary.  The patient and plan discussed with Glenford Peers, MD and he is in agreement with the aforementioned.   Aldan Camey

## 2011-11-25 ENCOUNTER — Other Ambulatory Visit (HOSPITAL_COMMUNITY): Payer: Self-pay | Admitting: Oncology

## 2011-11-26 ENCOUNTER — Telehealth (HOSPITAL_COMMUNITY): Payer: Self-pay | Admitting: *Deleted

## 2011-11-26 ENCOUNTER — Encounter (HOSPITAL_BASED_OUTPATIENT_CLINIC_OR_DEPARTMENT_OTHER): Payer: 59

## 2011-11-26 DIAGNOSIS — I2699 Other pulmonary embolism without acute cor pulmonale: Secondary | ICD-10-CM

## 2011-11-26 DIAGNOSIS — C7951 Secondary malignant neoplasm of bone: Secondary | ICD-10-CM

## 2011-11-26 DIAGNOSIS — C7A8 Other malignant neuroendocrine tumors: Secondary | ICD-10-CM

## 2011-11-26 LAB — COMPREHENSIVE METABOLIC PANEL
ALT: 15 U/L (ref 0–53)
AST: 14 U/L (ref 0–37)
CO2: 23 mEq/L (ref 19–32)
Chloride: 97 mEq/L (ref 96–112)
GFR calc non Af Amer: >90 mL/min (ref >90)
Potassium: 3.9 mEq/L (ref 3.5–5.1)
Sodium: 132 mEq/L — ABNORMAL LOW (ref 135–145)
Total Bilirubin: 0.2 mg/dL — ABNORMAL LOW (ref 0.3–1.2)

## 2011-11-26 LAB — DIFFERENTIAL
Basophils Absolute: 0.1 10*3/uL (ref 0.0–0.1)
Lymphocytes Relative: 21 % (ref 12–46)
Monocytes Absolute: 0.7 10*3/uL (ref 0.1–1.0)
Neutro Abs: 4.2 10*3/uL (ref 1.7–7.7)

## 2011-11-26 LAB — CBC
HCT: 34.2 % — ABNORMAL LOW (ref 39.0–52.0)
Platelets: 167 10*3/uL (ref 150–400)
RDW: 18.1 % — ABNORMAL HIGH (ref 11.5–15.5)
WBC: 6.5 10*3/uL (ref 4.0–10.5)

## 2011-11-26 NOTE — Progress Notes (Signed)
Labs drawn today for cbc/diff,cmp 

## 2011-11-27 ENCOUNTER — Other Ambulatory Visit (HOSPITAL_COMMUNITY): Payer: Self-pay

## 2011-11-27 ENCOUNTER — Encounter (HOSPITAL_BASED_OUTPATIENT_CLINIC_OR_DEPARTMENT_OTHER): Payer: 59

## 2011-11-27 DIAGNOSIS — I2699 Other pulmonary embolism without acute cor pulmonale: Secondary | ICD-10-CM

## 2011-11-27 NOTE — Progress Notes (Signed)
Labs drawn today for pt. Patient on 5mg  of coud.  Call patient at [phone]

## 2011-12-03 ENCOUNTER — Encounter (HOSPITAL_COMMUNITY): Payer: 59 | Attending: Oncology

## 2011-12-03 ENCOUNTER — Other Ambulatory Visit (HOSPITAL_COMMUNITY): Payer: 59

## 2011-12-03 VITALS — BP 110/81 | HR 96 | Temp 97.2°F | Wt 194.8 lb

## 2011-12-03 DIAGNOSIS — C7B8 Other secondary neuroendocrine tumors: Secondary | ICD-10-CM

## 2011-12-03 DIAGNOSIS — Z5111 Encounter for antineoplastic chemotherapy: Secondary | ICD-10-CM

## 2011-12-03 DIAGNOSIS — I2699 Other pulmonary embolism without acute cor pulmonale: Secondary | ICD-10-CM | POA: Insufficient documentation

## 2011-12-03 DIAGNOSIS — C7A8 Other malignant neuroendocrine tumors: Secondary | ICD-10-CM

## 2011-12-03 DIAGNOSIS — C801 Malignant (primary) neoplasm, unspecified: Secondary | ICD-10-CM | POA: Insufficient documentation

## 2011-12-03 DIAGNOSIS — C7951 Secondary malignant neoplasm of bone: Secondary | ICD-10-CM | POA: Insufficient documentation

## 2011-12-03 DIAGNOSIS — C7A090 Malignant carcinoid tumor of the bronchus and lung: Secondary | ICD-10-CM

## 2011-12-03 MED ORDER — SODIUM CHLORIDE 0.9 % IV SOLN
Freq: Once | INTRAVENOUS | Status: AC
  Administered 2011-12-03: 11:00:00 via INTRAVENOUS
  Filled 2011-12-03: qty 5

## 2011-12-03 MED ORDER — HEPARIN SOD (PORK) LOCK FLUSH 100 UNIT/ML IV SOLN
500.0000 [IU] | Freq: Once | INTRAVENOUS | Status: AC | PRN
  Administered 2011-12-03: 500 [IU]
  Filled 2011-12-03: qty 5

## 2011-12-03 MED ORDER — SODIUM CHLORIDE 0.9 % IV SOLN
20.0000 mg/m2 | Freq: Once | INTRAVENOUS | Status: AC
  Administered 2011-12-03: 44 mg via INTRAVENOUS
  Filled 2011-12-03: qty 44

## 2011-12-03 MED ORDER — SODIUM CHLORIDE 0.9 % IV SOLN
INTRAVENOUS | Status: AC
  Administered 2011-12-03: 10:00:00 via INTRAVENOUS

## 2011-12-03 MED ORDER — SODIUM CHLORIDE 0.9 % IV SOLN
100.0000 mg/m2 | Freq: Once | INTRAVENOUS | Status: AC
  Administered 2011-12-03: 220 mg via INTRAVENOUS
  Filled 2011-12-03: qty 11

## 2011-12-03 MED ORDER — DEXAMETHASONE SODIUM PHOSPHATE 4 MG/ML IJ SOLN: 12.0000 mg | Freq: Once | INTRAMUSCULAR | Status: DC

## 2011-12-03 MED ORDER — HEPARIN SOD (PORK) LOCK FLUSH 100 UNIT/ML IV SOLN
INTRAVENOUS | Status: AC
  Filled 2011-12-03: qty 5

## 2011-12-03 MED ORDER — PALONOSETRON HCL INJECTION 0.25 MG/5ML
0.2500 mg | Freq: Once | INTRAVENOUS | Status: AC
  Administered 2011-12-03: 0.25 mg via INTRAVENOUS

## 2011-12-03 MED ORDER — SODIUM CHLORIDE 0.9 % IV SOLN: 150.0000 mg | Freq: Once | INTRAVENOUS | Status: DC

## 2011-12-03 MED ORDER — PALONOSETRON HCL INJECTION 0.25 MG/5ML
INTRAVENOUS | Status: AC
  Filled 2011-12-03: qty 5

## 2011-12-04 ENCOUNTER — Encounter (HOSPITAL_BASED_OUTPATIENT_CLINIC_OR_DEPARTMENT_OTHER): Payer: 59

## 2011-12-04 VITALS — BP 121/75 | HR 84 | Temp 96.7°F | Wt 199.9 lb

## 2011-12-04 DIAGNOSIS — Z5111 Encounter for antineoplastic chemotherapy: Secondary | ICD-10-CM

## 2011-12-04 DIAGNOSIS — C7951 Secondary malignant neoplasm of bone: Secondary | ICD-10-CM

## 2011-12-04 DIAGNOSIS — C7A090 Malignant carcinoid tumor of the bronchus and lung: Secondary | ICD-10-CM

## 2011-12-04 DIAGNOSIS — C7B8 Other secondary neuroendocrine tumors: Secondary | ICD-10-CM

## 2011-12-04 DIAGNOSIS — C7A8 Other malignant neuroendocrine tumors: Secondary | ICD-10-CM

## 2011-12-04 MED ORDER — SODIUM CHLORIDE 0.9 % IV SOLN
100.0000 mg/m2 | Freq: Once | INTRAVENOUS | Status: AC
  Administered 2011-12-04: 220 mg via INTRAVENOUS
  Filled 2011-12-04: qty 11

## 2011-12-04 MED ORDER — SODIUM CHLORIDE 0.9 % IV SOLN
INTRAVENOUS | Status: AC
  Administered 2011-12-04: 10:00:00 via INTRAVENOUS

## 2011-12-04 MED ORDER — HEPARIN SOD (PORK) LOCK FLUSH 100 UNIT/ML IV SOLN
INTRAVENOUS | Status: AC
  Filled 2011-12-04: qty 5

## 2011-12-04 MED ORDER — SODIUM CHLORIDE 0.9 % IJ SOLN
10.0000 mL | INTRAMUSCULAR | Status: DC | PRN
  Administered 2011-12-04: 10 mL
  Filled 2011-12-04: qty 10

## 2011-12-04 MED ORDER — DEXAMETHASONE SODIUM PHOSPHATE 10 MG/ML IJ SOLN: 10.0000 mg | Freq: Once | INTRAMUSCULAR | Status: DC

## 2011-12-04 MED ORDER — SODIUM CHLORIDE 0.9 % IV SOLN
20.0000 mg/m2 | Freq: Once | INTRAVENOUS | Status: AC
  Administered 2011-12-04: 44 mg via INTRAVENOUS
  Filled 2011-12-04: qty 44

## 2011-12-04 MED ORDER — HEPARIN SOD (PORK) LOCK FLUSH 100 UNIT/ML IV SOLN
500.0000 [IU] | Freq: Once | INTRAVENOUS | Status: AC | PRN
  Administered 2011-12-04: 500 [IU]
  Filled 2011-12-04: qty 5

## 2011-12-04 MED ORDER — SODIUM CHLORIDE 0.9 % IV SOLN
10.0000 mg | Freq: Once | INTRAVENOUS | Status: AC
  Administered 2011-12-04: 10 mg via INTRAVENOUS
  Filled 2011-12-04: qty 1

## 2011-12-04 MED ORDER — SODIUM CHLORIDE 0.9 % IJ SOLN
INTRAMUSCULAR | Status: AC
  Filled 2011-12-04: qty 10

## 2011-12-05 ENCOUNTER — Encounter (HOSPITAL_BASED_OUTPATIENT_CLINIC_OR_DEPARTMENT_OTHER): Payer: 59

## 2011-12-05 ENCOUNTER — Other Ambulatory Visit (HOSPITAL_COMMUNITY): Payer: Self-pay | Admitting: Oncology

## 2011-12-05 VITALS — BP 106/69 | HR 60 | Temp 97.3°F | Wt 200.7 lb

## 2011-12-05 DIAGNOSIS — I2699 Other pulmonary embolism without acute cor pulmonale: Secondary | ICD-10-CM

## 2011-12-05 DIAGNOSIS — C7951 Secondary malignant neoplasm of bone: Secondary | ICD-10-CM

## 2011-12-05 DIAGNOSIS — C7A8 Other malignant neuroendocrine tumors: Secondary | ICD-10-CM

## 2011-12-05 DIAGNOSIS — Z5111 Encounter for antineoplastic chemotherapy: Secondary | ICD-10-CM

## 2011-12-05 DIAGNOSIS — C7A090 Malignant carcinoid tumor of the bronchus and lung: Secondary | ICD-10-CM

## 2011-12-05 DIAGNOSIS — C7B8 Other secondary neuroendocrine tumors: Secondary | ICD-10-CM

## 2011-12-05 LAB — PROTIME-INR: Prothrombin Time: 43 seconds — ABNORMAL HIGH (ref 11.6–15.2)

## 2011-12-05 MED ORDER — SODIUM CHLORIDE 0.9 % IJ SOLN
INTRAMUSCULAR | Status: AC
  Filled 2011-12-05: qty 10

## 2011-12-05 MED ORDER — SODIUM CHLORIDE 0.9 % IJ SOLN
10.0000 mL | INTRAMUSCULAR | Status: DC | PRN
  Administered 2011-12-05: 10 mL
  Filled 2011-12-05: qty 10

## 2011-12-05 MED ORDER — SODIUM CHLORIDE 0.9 % IV SOLN
100.0000 mg/m2 | Freq: Once | INTRAVENOUS | Status: AC
  Administered 2011-12-05: 220 mg via INTRAVENOUS
  Filled 2011-12-05: qty 11

## 2011-12-05 MED ORDER — SODIUM CHLORIDE 0.9 % IV SOLN
20.0000 mg | Freq: Once | INTRAVENOUS | Status: DC
  Filled 2011-12-05: qty 2

## 2011-12-05 MED ORDER — DEXAMETHASONE SODIUM PHOSPHATE 10 MG/ML IJ SOLN: 10.0000 mg | Freq: Once | INTRAMUSCULAR | Status: DC

## 2011-12-05 MED ORDER — HEPARIN SOD (PORK) LOCK FLUSH 100 UNIT/ML IV SOLN
INTRAVENOUS | Status: AC
  Filled 2011-12-05: qty 5

## 2011-12-05 MED ORDER — SODIUM CHLORIDE 0.9 % IV SOLN
20.0000 mg/m2 | Freq: Once | INTRAVENOUS | Status: AC
  Administered 2011-12-05: 44 mg via INTRAVENOUS
  Filled 2011-12-05: qty 44

## 2011-12-05 MED ORDER — SODIUM CHLORIDE 0.9 % IV SOLN
10.0000 mg | Freq: Once | INTRAVENOUS | Status: AC
  Administered 2011-12-05: 10 mg via INTRAVENOUS
  Filled 2011-12-05: qty 1

## 2011-12-05 MED ORDER — SODIUM CHLORIDE 0.9 % IV SOLN
INTRAVENOUS | Status: AC
  Administered 2011-12-05: 10:00:00 via INTRAVENOUS

## 2011-12-05 MED ORDER — HEPARIN SOD (PORK) LOCK FLUSH 100 UNIT/ML IV SOLN
500.0000 [IU] | Freq: Once | INTRAVENOUS | Status: AC | PRN
  Administered 2011-12-05: 500 [IU]
  Filled 2011-12-05: qty 5

## 2011-12-06 ENCOUNTER — Encounter (HOSPITAL_BASED_OUTPATIENT_CLINIC_OR_DEPARTMENT_OTHER): Payer: 59

## 2011-12-06 VITALS — BP 148/55 | HR 73 | Temp 97.5°F | Wt 203.0 lb

## 2011-12-06 DIAGNOSIS — C7A8 Other malignant neuroendocrine tumors: Secondary | ICD-10-CM

## 2011-12-06 DIAGNOSIS — C7B8 Other secondary neuroendocrine tumors: Secondary | ICD-10-CM

## 2011-12-06 DIAGNOSIS — Z5189 Encounter for other specified aftercare: Secondary | ICD-10-CM

## 2011-12-06 DIAGNOSIS — C7A090 Malignant carcinoid tumor of the bronchus and lung: Secondary | ICD-10-CM

## 2011-12-06 DIAGNOSIS — C7951 Secondary malignant neoplasm of bone: Secondary | ICD-10-CM

## 2011-12-06 MED ORDER — HEPARIN SOD (PORK) LOCK FLUSH 100 UNIT/ML IV SOLN
500.0000 [IU] | Freq: Once | INTRAVENOUS | Status: AC
  Administered 2011-12-06: 500 [IU] via INTRAVENOUS
  Filled 2011-12-06: qty 5

## 2011-12-06 MED ORDER — SODIUM CHLORIDE 0.9 % IV SOLN
INTRAVENOUS | Status: DC
  Administered 2011-12-06: 10:00:00 via INTRAVENOUS

## 2011-12-06 MED ORDER — PEGFILGRASTIM INJECTION 6 MG/0.6ML
6.0000 mg | Freq: Once | SUBCUTANEOUS | Status: AC
  Administered 2011-12-06: 6 mg via SUBCUTANEOUS

## 2011-12-06 MED ORDER — PEGFILGRASTIM INJECTION 6 MG/0.6ML
SUBCUTANEOUS | Status: AC
  Filled 2011-12-06: qty 0.6

## 2011-12-06 MED ORDER — HEPARIN SOD (PORK) LOCK FLUSH 100 UNIT/ML IV SOLN
INTRAVENOUS | Status: AC
  Filled 2011-12-06: qty 5

## 2011-12-07 ENCOUNTER — Encounter (HOSPITAL_COMMUNITY): Payer: Self-pay

## 2011-12-07 ENCOUNTER — Encounter (HOSPITAL_BASED_OUTPATIENT_CLINIC_OR_DEPARTMENT_OTHER): Payer: 59

## 2011-12-07 ENCOUNTER — Other Ambulatory Visit (HOSPITAL_COMMUNITY): Payer: Self-pay

## 2011-12-07 DIAGNOSIS — I2699 Other pulmonary embolism without acute cor pulmonale: Secondary | ICD-10-CM | POA: Insufficient documentation

## 2011-12-07 LAB — PROTIME-INR: INR: 2.09 — ABNORMAL HIGH (ref 0.00–1.49)

## 2011-12-13 ENCOUNTER — Telehealth (HOSPITAL_COMMUNITY): Payer: Self-pay | Admitting: *Deleted

## 2011-12-13 ENCOUNTER — Encounter (HOSPITAL_BASED_OUTPATIENT_CLINIC_OR_DEPARTMENT_OTHER): Payer: 59

## 2011-12-13 ENCOUNTER — Other Ambulatory Visit (HOSPITAL_COMMUNITY): Payer: Self-pay | Admitting: Oncology

## 2011-12-13 DIAGNOSIS — I2699 Other pulmonary embolism without acute cor pulmonale: Secondary | ICD-10-CM

## 2011-12-13 DIAGNOSIS — C7951 Secondary malignant neoplasm of bone: Secondary | ICD-10-CM

## 2011-12-13 DIAGNOSIS — C7B8 Other secondary neuroendocrine tumors: Secondary | ICD-10-CM

## 2011-12-13 DIAGNOSIS — C7A090 Malignant carcinoid tumor of the bronchus and lung: Secondary | ICD-10-CM

## 2011-12-13 LAB — PROTIME-INR
INR: 1.53 — ABNORMAL HIGH (ref 0.00–1.49)
Prothrombin Time: 18.7 seconds — ABNORMAL HIGH (ref 11.6–15.2)

## 2011-12-13 MED ORDER — DENOSUMAB 120 MG/1.7ML ~~LOC~~ SOLN
120.0000 mg | Freq: Once | SUBCUTANEOUS | Status: AC
  Administered 2011-12-13: 120 mg via SUBCUTANEOUS
  Filled 2011-12-13: qty 1.7

## 2011-12-13 MED ORDER — SODIUM CHLORIDE 0.9 % IV SOLN: Freq: Once | INTRAVENOUS | Status: DC | PRN

## 2011-12-13 NOTE — Telephone Encounter (Signed)
Message copied by Dennie Maizes on Thu Dec 13, 2011  1:40 PM ------      Message from: Ellouise Newer III      Created: Thu Dec 13, 2011 12:40 PM       Any missed doses?              Increase to 5 mg / 6 mg alternating.  PT/INR on Tuesday

## 2011-12-13 NOTE — Telephone Encounter (Signed)
Pt reports he may have missed one dose but no more than that. Per T.Kefalas,PA instruct pt to continue same dose Coumadin instead of increasing dose and we will recheck pt/inr next Tuesday when here for chemo. Pt verbalized understanding.

## 2011-12-13 NOTE — Progress Notes (Signed)
[name] Conner presents today for injection per the provider's orders.  Xgeva administered administration without incident; see MAR for injection details.  Patient tolerated procedure well and without incident.  No questions or complaints noted at this time.

## 2011-12-14 NOTE — Progress Notes (Signed)
Lab draw

## 2011-12-24 ENCOUNTER — Other Ambulatory Visit (HOSPITAL_COMMUNITY): Payer: Self-pay | Admitting: Oncology

## 2011-12-24 ENCOUNTER — Other Ambulatory Visit (HOSPITAL_COMMUNITY): Payer: 59

## 2011-12-24 ENCOUNTER — Encounter (HOSPITAL_BASED_OUTPATIENT_CLINIC_OR_DEPARTMENT_OTHER): Payer: 59

## 2011-12-24 VITALS — BP 117/82 | HR 99 | Temp 97.6°F | Ht 74.0 in | Wt 193.0 lb

## 2011-12-24 DIAGNOSIS — C7A1 Malignant poorly differentiated neuroendocrine tumors: Secondary | ICD-10-CM

## 2011-12-24 DIAGNOSIS — C7B8 Other secondary neuroendocrine tumors: Secondary | ICD-10-CM

## 2011-12-24 DIAGNOSIS — I2699 Other pulmonary embolism without acute cor pulmonale: Secondary | ICD-10-CM

## 2011-12-24 DIAGNOSIS — C7951 Secondary malignant neoplasm of bone: Secondary | ICD-10-CM

## 2011-12-24 DIAGNOSIS — Z5111 Encounter for antineoplastic chemotherapy: Secondary | ICD-10-CM

## 2011-12-24 DIAGNOSIS — C7A8 Other malignant neuroendocrine tumors: Secondary | ICD-10-CM

## 2011-12-24 LAB — COMPREHENSIVE METABOLIC PANEL
AST: 15 U/L (ref 0–37)
Albumin: 3.8 g/dL (ref 3.5–5.2)
Alkaline Phosphatase: 86 U/L (ref 39–117)
BUN: 6 mg/dL (ref 6–23)
Chloride: 99 mEq/L (ref 96–112)
Potassium: 3.8 mEq/L (ref 3.5–5.1)
Total Bilirubin: 0.3 mg/dL (ref 0.3–1.2)
Total Protein: 7.2 g/dL (ref 6.0–8.3)

## 2011-12-24 LAB — CBC
MCH: 32.4 pg (ref 26.0–34.0)
MCHC: 33.4 g/dL (ref 30.0–36.0)
Platelets: 151 10*3/uL (ref 150–400)
RDW: 17.7 % — ABNORMAL HIGH (ref 11.5–15.5)

## 2011-12-24 LAB — DIFFERENTIAL
Basophils Absolute: 0 10*3/uL (ref 0.0–0.1)
Basophils Relative: 1 % (ref 0–1)
Eosinophils Absolute: 0.2 10*3/uL (ref 0.0–0.7)
Neutro Abs: 2.8 10*3/uL (ref 1.7–7.7)
Neutrophils Relative %: 61 % (ref 43–77)

## 2011-12-24 LAB — PROTIME-INR
INR: 2.6 — ABNORMAL HIGH (ref 0.00–1.49)
Prothrombin Time: 28.3 seconds — ABNORMAL HIGH (ref 11.6–15.2)

## 2011-12-24 MED ORDER — PALONOSETRON HCL INJECTION 0.25 MG/5ML
INTRAVENOUS | Status: AC
  Filled 2011-12-24: qty 5

## 2011-12-24 MED ORDER — DEXAMETHASONE SODIUM PHOSPHATE 4 MG/ML IJ SOLN: 12.0000 mg | Freq: Once | INTRAMUSCULAR | Status: DC

## 2011-12-24 MED ORDER — SODIUM CHLORIDE 0.9 % IV SOLN
20.0000 mg/m2 | Freq: Once | INTRAVENOUS | Status: AC
  Administered 2011-12-24: 44 mg via INTRAVENOUS
  Filled 2011-12-24: qty 44

## 2011-12-24 MED ORDER — SODIUM CHLORIDE 0.9 % IJ SOLN
10.0000 mL | INTRAMUSCULAR | Status: DC | PRN
  Administered 2011-12-24: 10 mL
  Filled 2011-12-24: qty 10

## 2011-12-24 MED ORDER — PALONOSETRON HCL INJECTION 0.25 MG/5ML
0.2500 mg | Freq: Once | INTRAVENOUS | Status: AC
  Administered 2011-12-24: 0.25 mg via INTRAVENOUS

## 2011-12-24 MED ORDER — SODIUM CHLORIDE 0.9 % IV SOLN
Freq: Once | INTRAVENOUS | Status: AC
  Administered 2011-12-24: 11:00:00 via INTRAVENOUS
  Filled 2011-12-24: qty 5

## 2011-12-24 MED ORDER — SODIUM CHLORIDE 0.9 % IV SOLN
INTRAVENOUS | Status: AC
  Administered 2011-12-24: 2000 mL via INTRAVENOUS

## 2011-12-24 MED ORDER — LOMUSTINE 40 MG PO CAPS
80.0000 mg | ORAL_CAPSULE | Freq: Once | ORAL | Status: AC
  Administered 2011-12-24: 80 mg via ORAL
  Filled 2011-12-24: qty 2

## 2011-12-24 MED ORDER — HEPARIN SOD (PORK) LOCK FLUSH 100 UNIT/ML IV SOLN
INTRAVENOUS | Status: AC
  Filled 2011-12-24: qty 5

## 2011-12-24 MED ORDER — HEPARIN SOD (PORK) LOCK FLUSH 100 UNIT/ML IV SOLN
500.0000 [IU] | Freq: Once | INTRAVENOUS | Status: AC | PRN
  Administered 2011-12-24: 500 [IU]
  Filled 2011-12-24: qty 5

## 2011-12-24 MED ORDER — SODIUM CHLORIDE 0.9 % IV SOLN
100.0000 mg/m2 | Freq: Once | INTRAVENOUS | Status: AC
  Administered 2011-12-24: 220 mg via INTRAVENOUS
  Filled 2011-12-24: qty 11

## 2011-12-24 MED ORDER — SODIUM CHLORIDE 0.9 % IV SOLN: 150.0000 mg | Freq: Once | INTRAVENOUS | Status: DC

## 2011-12-24 NOTE — Progress Notes (Signed)
Tolerated well

## 2011-12-25 ENCOUNTER — Encounter (HOSPITAL_BASED_OUTPATIENT_CLINIC_OR_DEPARTMENT_OTHER): Payer: 59

## 2011-12-25 VITALS — BP 123/74 | HR 86 | Temp 97.7°F | Wt 198.0 lb

## 2011-12-25 DIAGNOSIS — C7A8 Other malignant neuroendocrine tumors: Secondary | ICD-10-CM

## 2011-12-25 DIAGNOSIS — C7951 Secondary malignant neoplasm of bone: Secondary | ICD-10-CM

## 2011-12-25 DIAGNOSIS — Z5111 Encounter for antineoplastic chemotherapy: Secondary | ICD-10-CM

## 2011-12-25 DIAGNOSIS — C7B8 Other secondary neuroendocrine tumors: Secondary | ICD-10-CM

## 2011-12-25 DIAGNOSIS — C7A090 Malignant carcinoid tumor of the bronchus and lung: Secondary | ICD-10-CM

## 2011-12-25 MED ORDER — SODIUM CHLORIDE 0.9 % IJ SOLN
INTRAMUSCULAR | Status: AC
  Filled 2011-12-25: qty 10

## 2011-12-25 MED ORDER — SODIUM CHLORIDE 0.9 % IJ SOLN
10.0000 mL | INTRAMUSCULAR | Status: DC | PRN
  Administered 2011-12-25: 10 mL
  Filled 2011-12-25: qty 10

## 2011-12-25 MED ORDER — SODIUM CHLORIDE 0.9 % IV SOLN
20.0000 mg/m2 | Freq: Once | INTRAVENOUS | Status: AC
  Administered 2011-12-25: 44 mg via INTRAVENOUS
  Filled 2011-12-25: qty 44

## 2011-12-25 MED ORDER — SODIUM CHLORIDE 0.9 % IV SOLN
INTRAVENOUS | Status: AC
  Administered 2011-12-25: 1000 mL via INTRAVENOUS

## 2011-12-25 MED ORDER — SODIUM CHLORIDE 0.9 % IV SOLN
10.0000 mg | Freq: Once | INTRAVENOUS | Status: AC
  Administered 2011-12-25: 10 mg via INTRAVENOUS
  Filled 2011-12-25: qty 2.5

## 2011-12-25 MED ORDER — DEXAMETHASONE SODIUM PHOSPHATE 10 MG/ML IJ SOLN: 10.0000 mg | Freq: Once | INTRAMUSCULAR | Status: DC

## 2011-12-25 MED ORDER — HEPARIN SOD (PORK) LOCK FLUSH 100 UNIT/ML IV SOLN
INTRAVENOUS | Status: AC
  Filled 2011-12-25: qty 5

## 2011-12-25 MED ORDER — SODIUM CHLORIDE 0.9 % IV SOLN
100.0000 mg/m2 | Freq: Once | INTRAVENOUS | Status: AC
  Administered 2011-12-25: 220 mg via INTRAVENOUS
  Filled 2011-12-25: qty 11

## 2011-12-25 MED ORDER — HEPARIN SOD (PORK) LOCK FLUSH 100 UNIT/ML IV SOLN
500.0000 [IU] | Freq: Once | INTRAVENOUS | Status: DC | PRN
  Filled 2011-12-25: qty 5

## 2011-12-25 NOTE — Progress Notes (Signed)
Tolerated chemo without problems 

## 2011-12-26 ENCOUNTER — Encounter (HOSPITAL_BASED_OUTPATIENT_CLINIC_OR_DEPARTMENT_OTHER): Payer: 59

## 2011-12-26 VITALS — BP 129/78 | HR 59 | Temp 97.4°F | Ht 74.0 in | Wt 203.0 lb

## 2011-12-26 DIAGNOSIS — C7951 Secondary malignant neoplasm of bone: Secondary | ICD-10-CM

## 2011-12-26 DIAGNOSIS — Z5111 Encounter for antineoplastic chemotherapy: Secondary | ICD-10-CM

## 2011-12-26 DIAGNOSIS — C7A8 Other malignant neuroendocrine tumors: Secondary | ICD-10-CM

## 2011-12-26 DIAGNOSIS — C7A090 Malignant carcinoid tumor of the bronchus and lung: Secondary | ICD-10-CM

## 2011-12-26 DIAGNOSIS — C7B8 Other secondary neuroendocrine tumors: Secondary | ICD-10-CM

## 2011-12-26 MED ORDER — HEPARIN SOD (PORK) LOCK FLUSH 100 UNIT/ML IV SOLN
INTRAVENOUS | Status: AC
  Filled 2011-12-26: qty 5

## 2011-12-26 MED ORDER — SODIUM CHLORIDE 0.9 % IJ SOLN
INTRAMUSCULAR | Status: AC
  Filled 2011-12-26: qty 10

## 2011-12-26 MED ORDER — SODIUM CHLORIDE 0.9 % IV SOLN
20.0000 mg/m2 | Freq: Once | INTRAVENOUS | Status: AC
  Administered 2011-12-26: 44 mg via INTRAVENOUS
  Filled 2011-12-26: qty 44

## 2011-12-26 MED ORDER — SODIUM CHLORIDE 0.9 % IV SOLN
100.0000 mg/m2 | Freq: Once | INTRAVENOUS | Status: AC
  Administered 2011-12-26: 220 mg via INTRAVENOUS
  Filled 2011-12-26: qty 11

## 2011-12-26 MED ORDER — SODIUM CHLORIDE 0.9 % IV SOLN
INTRAVENOUS | Status: AC
  Administered 2011-12-26: 1000 mL via INTRAVENOUS

## 2011-12-26 MED ORDER — DEXAMETHASONE SODIUM PHOSPHATE 10 MG/ML IJ SOLN: 10.0000 mg | Freq: Once | INTRAMUSCULAR | Status: DC

## 2011-12-26 MED ORDER — SODIUM CHLORIDE 0.9 % IV SOLN
10.0000 mg | Freq: Once | INTRAVENOUS | Status: AC
  Administered 2011-12-26: 10 mg via INTRAVENOUS
  Filled 2011-12-26: qty 2.5

## 2011-12-26 MED ORDER — FUROSEMIDE 10 MG/ML IJ SOLN
20.0000 mg | Freq: Once | INTRAMUSCULAR | Status: AC
  Administered 2011-12-26: 20 mg via INTRAVENOUS

## 2011-12-26 MED ORDER — FUROSEMIDE 10 MG/ML IJ SOLN
INTRAMUSCULAR | Status: AC
  Filled 2011-12-26: qty 2

## 2011-12-26 MED ORDER — HEPARIN SOD (PORK) LOCK FLUSH 100 UNIT/ML IV SOLN
500.0000 [IU] | Freq: Once | INTRAVENOUS | Status: AC | PRN
  Administered 2011-12-26: 500 [IU]
  Filled 2011-12-26: qty 5

## 2011-12-26 MED ORDER — SODIUM CHLORIDE 0.9 % IJ SOLN
10.0000 mL | INTRAMUSCULAR | Status: DC | PRN
  Administered 2011-12-26: 10 mL
  Filled 2011-12-26: qty 10

## 2011-12-27 ENCOUNTER — Encounter (HOSPITAL_BASED_OUTPATIENT_CLINIC_OR_DEPARTMENT_OTHER): Payer: 59

## 2011-12-27 ENCOUNTER — Encounter (HOSPITAL_BASED_OUTPATIENT_CLINIC_OR_DEPARTMENT_OTHER): Payer: 59 | Admitting: Oncology

## 2011-12-27 ENCOUNTER — Encounter (HOSPITAL_COMMUNITY): Payer: Self-pay | Admitting: Oncology

## 2011-12-27 VITALS — BP 112/69 | HR 57 | Temp 97.2°F | Wt 195.6 lb

## 2011-12-27 DIAGNOSIS — C7A8 Other malignant neuroendocrine tumors: Secondary | ICD-10-CM

## 2011-12-27 DIAGNOSIS — C7951 Secondary malignant neoplasm of bone: Secondary | ICD-10-CM

## 2011-12-27 DIAGNOSIS — B354 Tinea corporis: Secondary | ICD-10-CM

## 2011-12-27 DIAGNOSIS — C7A090 Malignant carcinoid tumor of the bronchus and lung: Secondary | ICD-10-CM

## 2011-12-27 DIAGNOSIS — C7B8 Other secondary neuroendocrine tumors: Secondary | ICD-10-CM

## 2011-12-27 DIAGNOSIS — Z5189 Encounter for other specified aftercare: Secondary | ICD-10-CM

## 2011-12-27 MED ORDER — HEPARIN SOD (PORK) LOCK FLUSH 100 UNIT/ML IV SOLN
INTRAVENOUS | Status: AC
  Filled 2011-12-27: qty 5

## 2011-12-27 MED ORDER — SODIUM CHLORIDE 0.9 % IV SOLN
INTRAVENOUS | Status: AC
  Administered 2011-12-27: 1000 mL via INTRAVENOUS

## 2011-12-27 MED ORDER — PEGFILGRASTIM INJECTION 6 MG/0.6ML
6.0000 mg | Freq: Once | SUBCUTANEOUS | Status: AC
  Administered 2011-12-27: 6 mg via SUBCUTANEOUS

## 2011-12-27 MED ORDER — PEGFILGRASTIM INJECTION 6 MG/0.6ML
SUBCUTANEOUS | Status: AC
  Filled 2011-12-27: qty 0.6

## 2011-12-27 NOTE — Patient Instructions (Signed)
[facility]  Discharge Instructions  RECOMMENDATIONS MADE BY THE CONSULTANT AND ANY TEST RESULTS WILL BE SENT TO YOUR REFERRING DOCTOR.   EXAM FINDINGS BY MD TODAY AND SIGNS AND SYMPTOMS TO REPORT TO CLINIC OR PRIMARY MD:   SPECIAL INSTRUCTIONS/FOLLOW-UP: 1.  You're scheduled for CTs next week 2.  We will see you back in the office after your CTs to discuss future treatment.   I acknowledge that I have been informed and understand all the instructions given to me and received a copy. I do not have any more questions at this time, but understand that I may call the Specialty Clinic at [facility] at [phone] during business hours should I have any further questions or need assistance in obtaining follow-up care.    __________________________________________  _____________  __________ Signature of Patient or Authorized Representative            Date                   Time    __________________________________________ Nurse's Signature

## 2011-12-27 NOTE — Progress Notes (Signed)
[name] Ruths, MD, MD [address] [place] [address]  1. Bone metastases  CT Abdomen Pelvis W Contrast, CT Chest W Contrast  2. Neuroendocrine cancer  CT Abdomen Pelvis W Contrast, CT Chest W Contrast    CURRENT THERAPY:S/P 6 cycles of chemotherapy with cisplatin based regimen including VP-16 and CCNU. He has had CCNU with cycle 2 and 4 and 6. He is status post radiation therapy after resection of a huge mass in the right side of the brain that was approximately 6.5 cm.    INTERVAL HISTORY: [name] Conner 51 y.o. 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  The patient is doing very well.  He experienced some swelling that has resolved following IV fluids yesterday.  We gave him 20 mg IV Lasix yesterday after his treatment.  Subsequently, he urinated frequently yesterday and he reports that his swelling has resolved.    The patient is tolerating therapy well.  He denies any headaches or seizure activity.  He denies any neurologic symptoms.   We spent some time reviewing his upcoming treatments.  No more chemotherapy is ordered at this time.  We will move forward with restaging scans.  I have informed the patient that if his scan continues to show improvement, will likely administer 2-3 more cycles of chemotherapy.  He understands this information and is agreeable.   Past Medical History  Diagnosis Date  . Renal disorder     kidney stones  . Bone metastases 07/19/2011  . 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 PE (pulmonary embolism)   . Heart disease     [facility]   . Normal cardiac stress test     "14 years ago," reported on 12/27/11    has Bone metastases; Neuroendocrine cancer; and PE (pulmonary embolism) on his problem list.      has no known allergies.  Mr.  Armenti had no medications administered during this visit.  Past Surgical History  Procedure Date  . Kidney surgery 2010    rattach ureter after kidney stones  . Cardiac catheterization 2011  . Brain surgery nov 2012    tumor removed  . Lung biopsy   . Craniotomy for tumor 07/2011  . Porta-a-cath insertion 07/23/11   . Powerport   . Portacath placement 07/23/2011    Procedure: INSERTION PORT-A-CATH;  Surgeon: Dalia Heading;  Location: [facility];  Service: General;  Laterality: Left;  Power Port (Left chest) attached to 96F catheter in Left Subclavian    Denies any headaches, dizziness, double vision, fevers, chills, night sweats, nausea, vomiting, diarrhea, constipation, chest pain, heart palpitations, shortness of breath, blood in stool, black tarry stool, urinary pain, urinary burning, urinary frequency, hematuria.   PHYSICAL EXAMINATION  ECOG PERFORMANCE STATUS: 1 - Symptomatic but completely ambulatory  There were no vitals filed for this visit.  GENERAL:alert, no distress, well nourished, well developed, comfortable, cooperative and smiling SKIN: skin color, texture, turgor are normal, no rashes or significant lesions HEAD: Normocephalic, No masses, lesions, tenderness or abnormalities EYES: normal, EOMI, Conjunctiva are pink and non-injected EARS: External ears normal OROPHARYNX:lips, buccal mucosa, and tongue normal and mucous membranes are moist  NECK: supple, no adenopathy, thyroid normal size, non-tender, without nodularity, no stridor, non-tender, trachea midline LYMPH:  no palpable lymphadenopathy, no hepatosplenomegaly BREAST:not  examined LUNGS: clear to auscultation and percussion HEART: regular rate & rhythm, no murmurs, no gallops, S1 normal and S2 normal ABDOMEN:abdomen soft, non-tender, normal bowel sounds, no masses or organomegaly and no hepatosplenomegaly BACK: Back symmetric, no curvature., No CVA tenderness EXTREMITIES:less then 2 second capillary refill, no  joint deformities, effusion, or inflammation, no edema, no skin discoloration, no clubbing, no cyanosis  NEURO: alert & oriented x 3 with fluent speech, no focal motor/sensory deficits, gait normal   LABORATORY DATA: CBC    Component Value Date/Time   WBC 4.6 12/24/2011 0832   RBC 3.73* 12/24/2011 0832   HGB 12.1* 12/24/2011 0832   HCT 36.2* 12/24/2011 0832   PLT 151 12/24/2011 0832   MCV 97.1 12/24/2011 0832   MCH 32.4 12/24/2011 0832   MCHC 33.4 12/24/2011 0832   RDW 17.7* 12/24/2011 0832   LYMPHSABS 0.9 12/24/2011 0832   MONOABS 0.6 12/24/2011 0832   EOSABS 0.2 12/24/2011 0832   BASOSABS 0.0 12/24/2011 0832      Chemistry      Component Value Date/Time   NA 135 12/24/2011 0832   K 3.8 12/24/2011 0832   CL 99 12/24/2011 0832   CO2 22 12/24/2011 0832   BUN 6 12/24/2011 0832   CREATININE 0.92 12/24/2011 0832      Component Value Date/Time   CALCIUM 9.2 12/24/2011 0832   ALKPHOS 86 12/24/2011 0832   AST 15 12/24/2011 0832   ALT 14 12/24/2011 0832   BILITOT 0.3 12/24/2011 0832      PATHOLOGY: 07/06/2011  Diagnosis  1. Soft tissue mass, biopsy, Epidural tumor with intracranial extension  - METASTATIC POORLY DIFFERENTIATED CARCINOMA WITH NEUROENDOCRINE  DIFFERENTIATION.  - SEE COMMENT.  2. Soft tissue mass, simple excision  - METASTATIC POORLY DIFFERENTIATED CARCINOMA WITH NEUROENDOCRINE  DIFFERENTIATION.  - SEE COMMENT.  3. Dura mater  - METASTATIC POORLY DIFFERENTIATED CARCINOMA WITH NEUROENDOCRINE  DIFFERENTIATION.  - SEE COMMENT.  Microscopic Comment  1. , 2. and 3. By immunohistochemistry, the malignant cells are positive for CD56, synaptophysin,  chromogranin, cytokeratin 7, and faintly positive for neuron specific enolase, as well as TTF-1. They are  negative for S100, cytokeratin 20, cytokeratin 5/6, GFAP, Melan-A, Napsin, and p63. Overall, the findings  are consistent with metastatic poorly differentiated carcinoma with neuroendocrine features and favor  metastatic large  cell neuroendocrine carcinoma. Dr.Bassam Smir has reviewed the case and is in essential  agreement with this interpretation. (JK:jy) 07/10/11  Pecola Leisure MD  Pathologist, Electronic Signature  (Case signed 07/10/2011)    ASSESSMENT:  1. Metastatic neuroendocrine carcinoma, consistent with a poorly differentiated process from the lung, namely a large cell neuroendocrine cancer. 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 That MRI first saw this on 07/01/2011. The intracranial extension from the right lateral calvarium was 58 x 55 x 64 mm. He also appeared to have a parietal bone mass with intracranial extension as well. He had a C2 vertebral body metastasis. He also has a femur metastasis, presently asymptomatic from that at this time. His CT scan of the chest and abdomen revealed a right lower lobe mass 4.1 x 3.6 cm with also a satellite nodule in the right lower lobe of 2.0 x 1.8 cm. He had emphysematous changes and he also had metastatic disease to the hilum and mediastinum. His CT of the abdomen revealed at least 7 liver metastases,  lymphadenopathy in the porta hepatis, a nodule involving the right adrenal gland that was indeterminate but felt to be most likely metastatic disease. He has had radiation therapy following his surgical resection of the brain and skull lesion. He has had 6 full cycles now of cisplatin, VP-16, and CCNU (every other cycle) chemotherapy. His most recent restaging CT scan revealed impressive improvement.  2. Tinea corpora, especially of the lower legs.  3. Possible methicillin-resistant Staphylococcus aureus of the groin on the right and left and left hand and wrist area. Treated with Septra DS, resolved.  4. Candidiasis of the oral mucosa treated with Diflucan successfully in the past, resolved.    PLAN:  1. I personally reviewed and  went over laboratory results with the patient. 2. Restaging CT CAP with contrast for restaging purposes following 6 cycles of chemotherapy.  3. Discussed the likely possibility that we will add 2-3 more cycles of chemotherapy to his regimen if his scans continue to reveal improvement. He is agreeable. 4. Patient education regarding the maximum chemotherapy one can typically tolerate.  5. Encouraged the patient to continue PO fluids to help protect the kidneys.  6. Return following scans for discussion about results and continuation of chemotherapy.  At some time we may need to consider an MRI of brain and a plain film    All questions were answered. The patient knows to call the clinic with any problems, questions or concerns. We can certainly see the patient much sooner if necessary.    Raheem Kolbe

## 2011-12-27 NOTE — Progress Notes (Signed)
[name] Conner presents today for injection per MD orders along with his hydration. Neulasta 6mg  administered SQ in right Abdomen. Administration without incident. Patient tolerated well.

## 2011-12-27 NOTE — Progress Notes (Signed)
Tolerated well

## 2011-12-31 ENCOUNTER — Encounter (HOSPITAL_BASED_OUTPATIENT_CLINIC_OR_DEPARTMENT_OTHER): Payer: 59

## 2011-12-31 ENCOUNTER — Other Ambulatory Visit (HOSPITAL_COMMUNITY): Payer: Self-pay | Admitting: Oncology

## 2011-12-31 DIAGNOSIS — I2699 Other pulmonary embolism without acute cor pulmonale: Secondary | ICD-10-CM

## 2011-12-31 NOTE — Progress Notes (Signed)
Lab draw

## 2012-01-03 ENCOUNTER — Ambulatory Visit (HOSPITAL_COMMUNITY)
Admission: RE | Admit: 2012-01-03 | Discharge: 2012-01-03 | Disposition: A | Discharge status: Home / Self Care | Payer: 59 | Source: Ambulatory Visit | Attending: Oncology | Admitting: Oncology

## 2012-01-03 DIAGNOSIS — C7931 Secondary malignant neoplasm of brain: Secondary | ICD-10-CM | POA: Insufficient documentation

## 2012-01-03 DIAGNOSIS — Z9221 Personal history of antineoplastic chemotherapy: Secondary | ICD-10-CM | POA: Insufficient documentation

## 2012-01-03 DIAGNOSIS — C787 Secondary malignant neoplasm of liver and intrahepatic bile duct: Secondary | ICD-10-CM | POA: Insufficient documentation

## 2012-01-03 DIAGNOSIS — N200 Calculus of kidney: Secondary | ICD-10-CM | POA: Insufficient documentation

## 2012-01-03 DIAGNOSIS — C7A8 Other malignant neuroendocrine tumors: Secondary | ICD-10-CM

## 2012-01-03 DIAGNOSIS — C7951 Secondary malignant neoplasm of bone: Secondary | ICD-10-CM | POA: Insufficient documentation

## 2012-01-03 DIAGNOSIS — C7952 Secondary malignant neoplasm of bone marrow: Secondary | ICD-10-CM | POA: Insufficient documentation

## 2012-01-03 DIAGNOSIS — C3490 Malignant neoplasm of unspecified part of unspecified bronchus or lung: Secondary | ICD-10-CM | POA: Insufficient documentation

## 2012-01-03 MED ORDER — IOHEXOL 300 MG/ML  SOLN
100.0000 mL | Freq: Once | INTRAMUSCULAR | Status: AC | PRN
  Administered 2012-01-03: 100 mL via INTRAVENOUS

## 2012-01-07 ENCOUNTER — Encounter (HOSPITAL_COMMUNITY): Payer: 59 | Attending: Oncology

## 2012-01-07 ENCOUNTER — Other Ambulatory Visit (HOSPITAL_COMMUNITY): Payer: Self-pay | Admitting: Oncology

## 2012-01-07 DIAGNOSIS — C801 Malignant (primary) neoplasm, unspecified: Secondary | ICD-10-CM | POA: Insufficient documentation

## 2012-01-07 DIAGNOSIS — I2699 Other pulmonary embolism without acute cor pulmonale: Secondary | ICD-10-CM

## 2012-01-07 DIAGNOSIS — C7951 Secondary malignant neoplasm of bone: Secondary | ICD-10-CM | POA: Insufficient documentation

## 2012-01-07 NOTE — Progress Notes (Signed)
Labs drawn today for pt.  Patient on 5mg . Call patient ay [phone]

## 2012-01-08 ENCOUNTER — Other Ambulatory Visit (HOSPITAL_COMMUNITY): Payer: Self-pay | Admitting: Oncology

## 2012-01-08 ENCOUNTER — Encounter (HOSPITAL_BASED_OUTPATIENT_CLINIC_OR_DEPARTMENT_OTHER): Payer: 59 | Admitting: Oncology

## 2012-01-08 VITALS — BP 109/72 | HR 99 | Temp 98.1°F | Wt 197.8 lb

## 2012-01-08 DIAGNOSIS — C787 Secondary malignant neoplasm of liver and intrahepatic bile duct: Secondary | ICD-10-CM

## 2012-01-08 DIAGNOSIS — C7A098 Malignant carcinoid tumors of other sites: Secondary | ICD-10-CM

## 2012-01-08 DIAGNOSIS — C7951 Secondary malignant neoplasm of bone: Secondary | ICD-10-CM

## 2012-01-08 DIAGNOSIS — C7B8 Other secondary neuroendocrine tumors: Secondary | ICD-10-CM

## 2012-01-08 DIAGNOSIS — C7A1 Malignant poorly differentiated neuroendocrine tumors: Secondary | ICD-10-CM

## 2012-01-08 DIAGNOSIS — C7A8 Other malignant neuroendocrine tumors: Secondary | ICD-10-CM

## 2012-01-08 DIAGNOSIS — I2699 Other pulmonary embolism without acute cor pulmonale: Secondary | ICD-10-CM

## 2012-01-08 NOTE — Patient Instructions (Signed)
[name] Conner  [id number] 06/17/1961 Dr. Glenford Peers  [facility]  Discharge Instructions  RECOMMENDATIONS MADE BY THE CONSULTANT AND ANY TEST RESULTS WILL BE SENT TO YOUR REFERRING DOCTOR.   EXAM FINDINGS BY MD TODAY AND SIGNS AND SYMPTOMS TO REPORT TO CLINIC OR PRIMARY MD: Exam and discussion per PA.  Will proceed with 2 more cycles of chemotherapy.  Report uncontrolled, nausea, vomiting, shortness of breath, fevers, etc.  MEDICATIONS PRESCRIBED: none     SPECIAL INSTRUCTIONS/FOLLOW-UP: Return to Clinic as scheduled for labs, chemotherapy and to see PA just before cycle 8.   I acknowledge that I have been informed and understand all the instructions given to me and received a copy. I do not have any more questions at this time, but understand that I may call the Specialty Clinic at [facility] at [phone] during business hours should I have any further questions or need assistance in obtaining follow-up care.    __________________________________________  _____________  __________ Signature of Patient or Authorized Representative            Date                   Time    __________________________________________ Nurse's Signature

## 2012-01-08 NOTE — Progress Notes (Signed)
[name] Ruths, MD, MD [address] [place] [address]  1. Bone metastases  DG Femur Right  2. PE (pulmonary embolism)    3. Neuroendocrine cancer  MR Brain W Wo Contrast, DG Femur Right, CBC, Differential, Comprehensive metabolic panel    CURRENT THERAPY: S/P 6 cycles of chemotherapy with cisplatin based regimen including VP-16 and CCNU. He has had CCNU with cycle 2 and 4 and 6. He is status post radiation therapy after resection of a huge mass in the right side of the brain that was approximately 6.5 cm.    INTERVAL HISTORY: [name] Conner 51 y.o. 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  The patient is here to review his recent restaging CT scans.  He underwent a CT CAP.  I personally reviewed and went over radiographic studies with the patient.  According to this CT report, his metastatic disease continues to improve, but complete resolution is not identified.  This was discussed in great detail.  He understands the results of the study.    We discussed continuation of therapy for 2 more cycles.  He is agreeable to this plan.  We will continue alternating CCNU and alternating cycle lengths.   I answered all questions including rescanning his brain with an MRI.  This will be performed after his next cycle of chemotherapy.   He denies any complaints today.  We spoke briefly about his INR.  He does admit that he may have missed a dose, and his wife interjects that he may have increased his Vit K intake with certain foods recently.  Both of which will cause an increase in his INR.  We will keep him at the same dose and check an INR in the near future.   Past Medical History  Diagnosis Date  . Renal disorder     kidney stones  . Bone metastases 07/19/2011  . 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 PE (pulmonary embolism)   . Heart disease     [facility]   . Normal cardiac stress test     "14 years ago," reported on 12/27/11    has Bone metastases; Neuroendocrine cancer; and PE (pulmonary embolism) on his problem list.      has no known allergies.  [name] Conner had no medications administered during this visit.  Past Surgical History  Procedure Date  . Kidney surgery 2010    rattach ureter after kidney stones  . Cardiac catheterization 2011  . Brain surgery nov 2012    tumor removed  . Lung biopsy   . Craniotomy for tumor 07/2011  . Porta-a-cath insertion 07/23/11   . Powerport   . Portacath placement 07/23/2011    Procedure: INSERTION PORT-A-CATH;  Surgeon: Dalia Heading;  Location: [facility];  Service: General;  Laterality: Left;  Power Port (Left chest) attached to 51F catheter in Left Subclavian    Denies any headaches, dizziness, double vision, fevers, chills, night sweats, nausea, vomiting, diarrhea, constipation, chest pain, heart palpitations, shortness of breath, blood in stool, black tarry stool, urinary pain, urinary burning, urinary frequency, hematuria.   PHYSICAL EXAMINATION  ECOG PERFORMANCE STATUS: 2 - Symptomatic, <50% confined to bed  Filed Vitals:   01/08/12 1113  BP: 109/72  Pulse: 99  Temp: 98.1 F (36.7 C)  GENERAL:alert, no distress, well nourished, well developed, comfortable, cooperative and smiling SKIN: skin color, texture, turgor are normal, no rashes or significant lesions HEAD: Normocephalic, No masses, lesions, tenderness or abnormalities EYES: normal, EOMI, Conjunctiva are pink and non-injected EARS: External ears normal OROPHARYNX:lips, buccal mucosa, and tongue normal and mucous membranes are moist  NECK: supple, trachea midline LYMPH:  not examined BREAST:not examined NEURO: alert & oriented x 3 with fluent speech, no focal motor/sensory deficits, gait normal   LABORATORY DATA: CBC    Component  Value Date/Time   WBC 4.6 12/24/2011 0832   RBC 3.73* 12/24/2011 0832   HGB 12.1* 12/24/2011 0832   HCT 36.2* 12/24/2011 0832   PLT 151 12/24/2011 0832   MCV 97.1 12/24/2011 0832   MCH 32.4 12/24/2011 0832   MCHC 33.4 12/24/2011 0832   RDW 17.7* 12/24/2011 0832   LYMPHSABS 0.9 12/24/2011 0832   MONOABS 0.6 12/24/2011 0832   EOSABS 0.2 12/24/2011 0832   BASOSABS 0.0 12/24/2011 0832    Lab Results  Component Value Date   INR 1.55* 01/07/2012   INR 3.52* 12/31/2011   INR 2.60* 12/24/2011     RADIOGRAPHIC STUDIES:  01/03/2012  *RADIOLOGY REPORT*  Clinical Data: Status post six cycles of chemotherapy. Right lung  cancer with bone, liver and brain metastases.  CT CHEST, ABDOMEN AND PELVIS WITH CONTRAST  Technique: Multidetector CT imaging of the chest, abdomen and  pelvis was performed following the standard protocol during bolus  administration of intravenous contrast.  Contrast: OMNIPAQUE IOHEXOL 300 MG/ML SOLN  Comparison: 10/29/2011.  CT CHEST  Findings: No pathologically enlarged mediastinal or axillary  adenopathy. Low attenuation lymphoid tissue in the hilar regions,  right greater than left, persists. Index right hilar lymph node  measures 12 mm (previously 17 mm). Heart size normal. No  pericardial effusion.  Centrilobular and paraseptal emphysema with bullous changes at the  apices. There has there has been interval improvement in  peribronchovascular consolidation in the right lower lobe, without  complete resolution, possibly due to scarring. No recurrent  nodularity. Scattered scarring in the left lower lobe. Scattered  small pulmonary nodules are unchanged. No pleural fluid. Airway  is unremarkable.  IMPRESSION:  1. Continued improvement in bihilar adenopathy, without complete  resolution.  2. Interval improvement in peribronchovascular consolidation in  the right lower lobe, without complete resolution, possibly due to  scarring.  3. Scattered small pulmonary  nodules are stable.  CT ABDOMEN AND PELVIS  Findings: Low attenuation lesions in the liver are smaller. Index  lesion in the dome of the liver measures 8 mm (previously 14 mm).  Gallbladder, adrenal glands and right kidney are unremarkable.  Left kidney is atrophic. Stones in the left kidney and renal  pelvis measure up to 1 cm. Left renal pelvis is dilated, but less  so than on 10/29/2011. Spleen, pancreas, stomach and bowel are  unremarkable.  Tiny left inguinal hernia contains fat. No pathologically enlarged  lymph nodes. No free fluid. Scattered atherosclerotic  calcification of the arterial vasculature. Calcifications are seen  in the prostate. No worrisome lytic or sclerotic lesions.  IMPRESSION:  1. Continued regression of hepatic metastatic disease, without  complete resolution.  2. Left nephrolithiasis with decreased dilatation of the left  renal pelvis.  Original Report Authenticated By: Reyes Ivan, M.D.    PATHOLOGY:  07/06/2011  Diagnosis  1. Soft tissue mass, biopsy, Epidural tumor with intracranial extension  - METASTATIC POORLY DIFFERENTIATED CARCINOMA WITH NEUROENDOCRINE  DIFFERENTIATION.  -  SEE COMMENT.  2. Soft tissue mass, simple excision  - METASTATIC POORLY DIFFERENTIATED CARCINOMA WITH NEUROENDOCRINE  DIFFERENTIATION.  - SEE COMMENT.  3. Dura mater  - METASTATIC POORLY DIFFERENTIATED CARCINOMA WITH NEUROENDOCRINE  DIFFERENTIATION.  - SEE COMMENT.  Microscopic Comment  1. , 2. and 3. By immunohistochemistry, the malignant cells are positive for CD56, synaptophysin,  chromogranin, cytokeratin 7, and faintly positive for neuron specific enolase, as well as TTF-1. They are  negative for S100, cytokeratin 20, cytokeratin 5/6, GFAP, Melan-A, Napsin, and p63. Overall, the findings  are consistent with metastatic poorly differentiated carcinoma with neuroendocrine features and favor  metastatic large cell neuroendocrine carcinoma. Dr.Bassam Smir has  reviewed the case and is in essential  agreement with this interpretation. (JK:jy) 07/10/11  Pecola Leisure MD  Pathologist, Electronic Signature  (Case signed 07/10/2011)    ASSESSMENT:  1. Metastatic neuroendocrine carcinoma, consistent with a poorly differentiated process from the lung, namely a large cell neuroendocrine cancer. 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 That MRI first saw this on 07/01/2011. The intracranial extension from the right lateral calvarium was 58 x 55 x 64 mm. He also appeared to have a parietal bone mass with intracranial extension as well. He had a C2 vertebral body metastasis. He also has a femur metastasis, presently asymptomatic from that at this time. His CT scan of the chest and abdomen revealed a right lower lobe mass 4.1 x 3.6 cm with also a satellite nodule in the right lower lobe of 2.0 x 1.8 cm. He had emphysematous changes and he also had metastatic disease to the hilum and mediastinum. His CT of the abdomen revealed at least 7 liver metastases, lymphadenopathy in the porta hepatis, a nodule involving the right adrenal gland that was indeterminate but felt to be most likely metastatic disease. He has had radiation therapy following his surgical resection of the brain and skull lesion. He has had 6 full cycles now of cisplatin, VP-16, and CCNU (every other cycle) chemotherapy. His most recent restaging CT scan revealed impressive improvement, but lacking complete resolution.   2. Tinea corpora, especially of the lower legs.  3. Possible methicillin-resistant Staphylococcus aureus of the groin on the right and left and left hand and wrist area. Treated with Septra DS, resolved.  4. Candidiasis of the oral mucosa treated with Diflucan successfully in the past, resolved.     PLAN:  1. I personally reviewed and went over  radiographic studies with the patient. 2. Will pursue 2 more cycles of chemotherapy. 3. Treatment plan altered to reflect #2. 4. Pre-chemotherapy lab work ordered: CBC diff, CMET 5. MRI brain with and without contrast following cycle 7 of chemotherapy. 6. DG right femur following cycle 7 of chemotherapy 7. Restaging CT scan 6-8 weeks following cycle 8 of chemotherapy. 8. Return before cycle 8.  Will perform pre-chemo therapy lab work at that time.  Will also review MRI and X-ray of femur results. Will schedule the patient for restaging CTs 6-8 weeks following cycle 8 at that time.   All questions were answered. The patient knows to call the clinic with any problems, questions or concerns. We can certainly see the patient much sooner if necessary.  The patient and plan discussed with Glenford Peers, MD and he is in agreement with the aforementioned.  [name] Conner

## 2012-01-14 ENCOUNTER — Encounter (HOSPITAL_COMMUNITY): Payer: 59

## 2012-01-14 ENCOUNTER — Other Ambulatory Visit (HOSPITAL_COMMUNITY): Payer: Self-pay | Admitting: Oncology

## 2012-01-14 ENCOUNTER — Telehealth (HOSPITAL_COMMUNITY): Payer: Self-pay | Admitting: *Deleted

## 2012-01-14 DIAGNOSIS — I2699 Other pulmonary embolism without acute cor pulmonale: Secondary | ICD-10-CM

## 2012-01-14 LAB — PROTIME-INR
INR: 1.85 — ABNORMAL HIGH (ref 0.00–1.49)
Prothrombin Time: 21.7 seconds — ABNORMAL HIGH (ref 11.6–15.2)

## 2012-01-14 NOTE — Telephone Encounter (Signed)
Notified to continue same dose coumadin and pt inr in 1 week.

## 2012-01-18 ENCOUNTER — Other Ambulatory Visit (HOSPITAL_COMMUNITY): Payer: 59

## 2012-01-18 ENCOUNTER — Other Ambulatory Visit (HOSPITAL_COMMUNITY): Payer: Self-pay | Admitting: Oncology

## 2012-01-18 ENCOUNTER — Encounter (HOSPITAL_COMMUNITY): Payer: 59

## 2012-01-18 DIAGNOSIS — K219 Gastro-esophageal reflux disease without esophagitis: Secondary | ICD-10-CM

## 2012-01-18 DIAGNOSIS — I2699 Other pulmonary embolism without acute cor pulmonale: Secondary | ICD-10-CM

## 2012-01-18 DIAGNOSIS — C7A8 Other malignant neuroendocrine tumors: Secondary | ICD-10-CM

## 2012-01-18 LAB — COMPREHENSIVE METABOLIC PANEL
Albumin: 3.7 g/dL (ref 3.5–5.2)
Alkaline Phosphatase: 85 U/L (ref 39–117)
BUN: 7 mg/dL (ref 6–23)
CO2: 22 mEq/L (ref 19–32)
Chloride: 102 mEq/L (ref 96–112)
Creatinine, Ser: 0.76 mg/dL (ref 0.50–1.35)
GFR calc Af Amer: >90 mL/min (ref >90)
GFR calc non Af Amer: >90 mL/min (ref >90)
Glucose, Bld: 88 mg/dL (ref 70–99)
Potassium: 3.5 mEq/L (ref 3.5–5.1)
Total Bilirubin: 0.3 mg/dL (ref 0.3–1.2)

## 2012-01-18 LAB — CBC
HCT: 27.3 % — ABNORMAL LOW (ref 39.0–52.0)
Hemoglobin: 9.6 g/dL — ABNORMAL LOW (ref 13.0–17.0)
MCHC: 35.2 g/dL (ref 30.0–36.0)
RBC: 2.73 MIL/uL — ABNORMAL LOW (ref 4.22–5.81)

## 2012-01-18 LAB — DIFFERENTIAL
Basophils Relative: 1 % (ref 0–1)
Lymphs Abs: 0.9 10*3/uL (ref 0.7–4.0)
Monocytes Absolute: 0.6 10*3/uL (ref 0.1–1.0)
Monocytes Relative: 15 % — ABNORMAL HIGH (ref 3–12)
Neutro Abs: 2.2 10*3/uL (ref 1.7–7.7)
Neutrophils Relative %: 59 % (ref 43–77)

## 2012-01-18 LAB — PROTIME-INR
INR: 2.09 — ABNORMAL HIGH (ref 0.00–1.49)
Prothrombin Time: 23.8 seconds — ABNORMAL HIGH (ref 11.6–15.2)

## 2012-01-18 MED ORDER — WARFARIN SODIUM 5 MG PO TABS: 5.0000 mg | ORAL_TABLET | Freq: Every day | ORAL | Status: DC

## 2012-01-18 MED ORDER — PANTOPRAZOLE SODIUM 40 MG PO TBEC: 40.0000 mg | DELAYED_RELEASE_TABLET | Freq: Every day | ORAL | Status: DC

## 2012-01-18 NOTE — Progress Notes (Signed)
Labs drawn today for cbc/diff.cmp,pt.  Patient on 5mg  of coud.  Call patient at [phone]

## 2012-01-21 ENCOUNTER — Other Ambulatory Visit (HOSPITAL_COMMUNITY): Payer: 59

## 2012-01-21 ENCOUNTER — Encounter (HOSPITAL_BASED_OUTPATIENT_CLINIC_OR_DEPARTMENT_OTHER): Payer: 59

## 2012-01-21 VITALS — BP 125/73 | HR 77 | Temp 97.6°F | Wt 196.0 lb

## 2012-01-21 DIAGNOSIS — C7B8 Other secondary neuroendocrine tumors: Secondary | ICD-10-CM

## 2012-01-21 DIAGNOSIS — C7951 Secondary malignant neoplasm of bone: Secondary | ICD-10-CM

## 2012-01-21 DIAGNOSIS — C7A8 Other malignant neuroendocrine tumors: Secondary | ICD-10-CM

## 2012-01-21 DIAGNOSIS — Z5111 Encounter for antineoplastic chemotherapy: Secondary | ICD-10-CM

## 2012-01-21 DIAGNOSIS — C787 Secondary malignant neoplasm of liver and intrahepatic bile duct: Secondary | ICD-10-CM

## 2012-01-21 DIAGNOSIS — C7A1 Malignant poorly differentiated neuroendocrine tumors: Secondary | ICD-10-CM

## 2012-01-21 MED ORDER — SODIUM CHLORIDE 0.9 % IV SOLN: 150.0000 mg | Freq: Once | INTRAVENOUS | Status: DC

## 2012-01-21 MED ORDER — HEPARIN SOD (PORK) LOCK FLUSH 100 UNIT/ML IV SOLN
500.0000 [IU] | Freq: Once | INTRAVENOUS | Status: AC | PRN
  Administered 2012-01-21: 500 [IU]
  Filled 2012-01-21: qty 5

## 2012-01-21 MED ORDER — SODIUM CHLORIDE 0.9 % IV SOLN
Freq: Once | INTRAVENOUS | Status: AC
  Administered 2012-01-21: 12:00:00 via INTRAVENOUS
  Filled 2012-01-21: qty 5

## 2012-01-21 MED ORDER — SODIUM CHLORIDE 0.9 % IV SOLN
INTRAVENOUS | Status: DC
  Administered 2012-01-21: 12:00:00 via INTRAVENOUS

## 2012-01-21 MED ORDER — DEXAMETHASONE SODIUM PHOSPHATE 4 MG/ML IJ SOLN: 12.0000 mg | Freq: Once | INTRAMUSCULAR | Status: DC

## 2012-01-21 MED ORDER — DENOSUMAB 120 MG/1.7ML ~~LOC~~ SOLN
120.0000 mg | Freq: Once | SUBCUTANEOUS | Status: AC
  Administered 2012-01-21: 120 mg via SUBCUTANEOUS
  Filled 2012-01-21: qty 1.7

## 2012-01-21 MED ORDER — SODIUM CHLORIDE 0.9 % IV SOLN
100.0000 mg/m2 | Freq: Once | INTRAVENOUS | Status: AC
  Administered 2012-01-21: 220 mg via INTRAVENOUS
  Filled 2012-01-21: qty 11

## 2012-01-21 MED ORDER — HEPARIN SOD (PORK) LOCK FLUSH 100 UNIT/ML IV SOLN
INTRAVENOUS | Status: AC
  Filled 2012-01-21: qty 5

## 2012-01-21 MED ORDER — SODIUM CHLORIDE 0.9 % IJ SOLN
INTRAMUSCULAR | Status: AC
  Filled 2012-01-21: qty 10

## 2012-01-21 MED ORDER — PALONOSETRON HCL INJECTION 0.25 MG/5ML
0.2500 mg | Freq: Once | INTRAVENOUS | Status: AC
  Administered 2012-01-21: 0.25 mg via INTRAVENOUS

## 2012-01-21 MED ORDER — SODIUM CHLORIDE 0.9 % IJ SOLN
10.0000 mL | INTRAMUSCULAR | Status: DC | PRN
  Administered 2012-01-21: 10 mL
  Filled 2012-01-21: qty 10

## 2012-01-21 MED ORDER — SODIUM CHLORIDE 0.9 % IV SOLN
20.0000 mg/m2 | Freq: Once | INTRAVENOUS | Status: AC
  Administered 2012-01-21: 44 mg via INTRAVENOUS
  Filled 2012-01-21: qty 44

## 2012-01-21 MED ORDER — PALONOSETRON HCL INJECTION 0.25 MG/5ML
INTRAVENOUS | Status: AC
  Filled 2012-01-21: qty 5

## 2012-01-21 NOTE — Progress Notes (Signed)
[name] Conner presents today for injection per MD orders. denosumab 120 mg administered SQ in right Abdomen. Administration without incident. Patient tolerated well.  Tolerated chemo well today.

## 2012-01-22 ENCOUNTER — Encounter (HOSPITAL_BASED_OUTPATIENT_CLINIC_OR_DEPARTMENT_OTHER): Payer: 59

## 2012-01-22 ENCOUNTER — Inpatient Hospital Stay (HOSPITAL_COMMUNITY): Payer: 59

## 2012-01-22 VITALS — BP 122/67 | HR 91 | Temp 97.6°F | Wt 200.0 lb

## 2012-01-22 DIAGNOSIS — C7951 Secondary malignant neoplasm of bone: Secondary | ICD-10-CM

## 2012-01-22 DIAGNOSIS — C7A8 Other malignant neuroendocrine tumors: Secondary | ICD-10-CM

## 2012-01-22 DIAGNOSIS — C7A090 Malignant carcinoid tumor of the bronchus and lung: Secondary | ICD-10-CM

## 2012-01-22 DIAGNOSIS — Z5111 Encounter for antineoplastic chemotherapy: Secondary | ICD-10-CM

## 2012-01-22 DIAGNOSIS — C7B8 Other secondary neuroendocrine tumors: Secondary | ICD-10-CM

## 2012-01-22 MED ORDER — SODIUM CHLORIDE 0.9 % IJ SOLN
10.0000 mL | INTRAMUSCULAR | Status: DC | PRN
  Administered 2012-01-22: 10 mL
  Filled 2012-01-22: qty 10

## 2012-01-22 MED ORDER — HEPARIN SOD (PORK) LOCK FLUSH 100 UNIT/ML IV SOLN
500.0000 [IU] | Freq: Once | INTRAVENOUS | Status: AC | PRN
  Administered 2012-01-22: 500 [IU]
  Filled 2012-01-22: qty 5

## 2012-01-22 MED ORDER — SODIUM CHLORIDE 0.9 % IV SOLN
10.0000 mg | Freq: Once | INTRAVENOUS | Status: AC
  Administered 2012-01-22: 10 mg via INTRAVENOUS
  Filled 2012-01-22: qty 1

## 2012-01-22 MED ORDER — HEPARIN SOD (PORK) LOCK FLUSH 100 UNIT/ML IV SOLN
INTRAVENOUS | Status: AC
  Filled 2012-01-22: qty 5

## 2012-01-22 MED ORDER — DEXAMETHASONE SODIUM PHOSPHATE 10 MG/ML IJ SOLN: 10.0000 mg | Freq: Once | INTRAMUSCULAR | Status: DC

## 2012-01-22 MED ORDER — ETOPOSIDE CHEMO INJECTION 20 MG/ML
100.0000 mg/m2 | Freq: Once | INTRAVENOUS | Status: AC
  Administered 2012-01-22: 220 mg via INTRAVENOUS
  Filled 2012-01-22: qty 11

## 2012-01-22 MED ORDER — SODIUM CHLORIDE 0.9 % IV SOLN
20.0000 mg/m2 | Freq: Once | INTRAVENOUS | Status: AC
  Administered 2012-01-22: 44 mg via INTRAVENOUS
  Filled 2012-01-22: qty 44

## 2012-01-22 MED ORDER — SODIUM CHLORIDE 0.9 % IJ SOLN
INTRAMUSCULAR | Status: AC
  Filled 2012-01-22: qty 10

## 2012-01-22 MED ORDER — SODIUM CHLORIDE 0.9 % IV SOLN
INTRAVENOUS | Status: AC
  Administered 2012-01-22: 09:00:00 via INTRAVENOUS

## 2012-01-22 NOTE — Progress Notes (Signed)
Tolerated chemo well. 

## 2012-01-23 ENCOUNTER — Inpatient Hospital Stay (HOSPITAL_COMMUNITY): Payer: 59

## 2012-01-23 ENCOUNTER — Encounter (HOSPITAL_BASED_OUTPATIENT_CLINIC_OR_DEPARTMENT_OTHER): Payer: 59

## 2012-01-23 VITALS — BP 123/79 | HR 56 | Temp 97.5°F | Wt 204.0 lb

## 2012-01-23 DIAGNOSIS — C7A090 Malignant carcinoid tumor of the bronchus and lung: Secondary | ICD-10-CM

## 2012-01-23 DIAGNOSIS — C7951 Secondary malignant neoplasm of bone: Secondary | ICD-10-CM

## 2012-01-23 DIAGNOSIS — Z5111 Encounter for antineoplastic chemotherapy: Secondary | ICD-10-CM

## 2012-01-23 DIAGNOSIS — C7A8 Other malignant neuroendocrine tumors: Secondary | ICD-10-CM

## 2012-01-23 DIAGNOSIS — C7B8 Other secondary neuroendocrine tumors: Secondary | ICD-10-CM

## 2012-01-23 MED ORDER — SODIUM CHLORIDE 0.9 % IV SOLN
10.0000 mg | Freq: Once | INTRAVENOUS | Status: AC
  Administered 2012-01-23: 10 mg via INTRAVENOUS
  Filled 2012-01-23: qty 1

## 2012-01-23 MED ORDER — SODIUM CHLORIDE 0.9 % IV SOLN
20.0000 mg/m2 | Freq: Once | INTRAVENOUS | Status: AC
  Administered 2012-01-23: 44 mg via INTRAVENOUS
  Filled 2012-01-23: qty 44

## 2012-01-23 MED ORDER — DEXAMETHASONE SODIUM PHOSPHATE 10 MG/ML IJ SOLN: 10.0000 mg | Freq: Once | INTRAMUSCULAR | Status: DC

## 2012-01-23 MED ORDER — SODIUM CHLORIDE 0.9 % IV SOLN
100.0000 mg/m2 | Freq: Once | INTRAVENOUS | Status: AC
  Administered 2012-01-23: 220 mg via INTRAVENOUS
  Filled 2012-01-23: qty 11

## 2012-01-23 MED ORDER — HEPARIN SOD (PORK) LOCK FLUSH 100 UNIT/ML IV SOLN
INTRAVENOUS | Status: AC
  Filled 2012-01-23: qty 5

## 2012-01-23 MED ORDER — SODIUM CHLORIDE 0.9 % IJ SOLN
10.0000 mL | INTRAMUSCULAR | Status: DC | PRN
  Administered 2012-01-23: 10 mL
  Filled 2012-01-23: qty 10

## 2012-01-23 MED ORDER — HEPARIN SOD (PORK) LOCK FLUSH 100 UNIT/ML IV SOLN
500.0000 [IU] | Freq: Once | INTRAVENOUS | Status: AC | PRN
  Administered 2012-01-23: 500 [IU]
  Filled 2012-01-23: qty 5

## 2012-01-23 MED ORDER — SODIUM CHLORIDE 0.9 % IV SOLN
INTRAVENOUS | Status: AC
  Administered 2012-01-23: 09:00:00 via INTRAVENOUS

## 2012-01-23 MED ORDER — SODIUM CHLORIDE 0.9 % IJ SOLN
INTRAMUSCULAR | Status: AC
  Filled 2012-01-23: qty 10

## 2012-01-23 NOTE — Progress Notes (Signed)
Tolerated chemo well. 

## 2012-01-24 ENCOUNTER — Inpatient Hospital Stay (HOSPITAL_COMMUNITY): Payer: 59

## 2012-01-24 ENCOUNTER — Encounter (HOSPITAL_BASED_OUTPATIENT_CLINIC_OR_DEPARTMENT_OTHER): Payer: 59

## 2012-01-24 ENCOUNTER — Ambulatory Visit (HOSPITAL_COMMUNITY): Payer: 59

## 2012-01-24 ENCOUNTER — Other Ambulatory Visit (HOSPITAL_COMMUNITY): Payer: Self-pay | Admitting: Oncology

## 2012-01-24 ENCOUNTER — Encounter (HOSPITAL_COMMUNITY): Payer: 59

## 2012-01-24 VITALS — BP 106/59 | HR 69 | Temp 97.2°F

## 2012-01-24 DIAGNOSIS — C7A090 Malignant carcinoid tumor of the bronchus and lung: Secondary | ICD-10-CM

## 2012-01-24 DIAGNOSIS — I2699 Other pulmonary embolism without acute cor pulmonale: Secondary | ICD-10-CM

## 2012-01-24 DIAGNOSIS — C7951 Secondary malignant neoplasm of bone: Secondary | ICD-10-CM

## 2012-01-24 DIAGNOSIS — C7A8 Other malignant neuroendocrine tumors: Secondary | ICD-10-CM

## 2012-01-24 DIAGNOSIS — C7B8 Other secondary neuroendocrine tumors: Secondary | ICD-10-CM

## 2012-01-24 DIAGNOSIS — Z5189 Encounter for other specified aftercare: Secondary | ICD-10-CM

## 2012-01-24 LAB — PROTIME-INR
INR: 2.14 — ABNORMAL HIGH (ref 0.00–1.49)
Prothrombin Time: 24.3 seconds — ABNORMAL HIGH (ref 11.6–15.2)

## 2012-01-24 MED ORDER — PEGFILGRASTIM INJECTION 6 MG/0.6ML
SUBCUTANEOUS | Status: AC
  Filled 2012-01-24: qty 0.6

## 2012-01-24 MED ORDER — HEPARIN SOD (PORK) LOCK FLUSH 100 UNIT/ML IV SOLN
500.0000 [IU] | Freq: Once | INTRAVENOUS | Status: AC
  Administered 2012-01-24: 500 [IU] via INTRAVENOUS
  Filled 2012-01-24: qty 5

## 2012-01-24 MED ORDER — WARFARIN SODIUM 5 MG PO TABS: 5.0000 mg | ORAL_TABLET | Freq: Every day | ORAL | Status: DC

## 2012-01-24 MED ORDER — SODIUM CHLORIDE 0.9 % IJ SOLN
INTRAMUSCULAR | Status: AC
  Filled 2012-01-24: qty 10

## 2012-01-24 MED ORDER — SODIUM CHLORIDE 0.9 % IV SOLN
INTRAVENOUS | Status: DC
  Administered 2012-01-24: 09:00:00 via INTRAVENOUS

## 2012-01-24 MED ORDER — PEGFILGRASTIM INJECTION 6 MG/0.6ML
6.0000 mg | Freq: Once | SUBCUTANEOUS | Status: AC
  Administered 2012-01-24: 6 mg via SUBCUTANEOUS

## 2012-01-24 MED ORDER — HEPARIN SOD (PORK) LOCK FLUSH 100 UNIT/ML IV SOLN
INTRAVENOUS | Status: AC
  Filled 2012-01-24: qty 5

## 2012-01-24 MED ORDER — SODIUM CHLORIDE 0.9 % IJ SOLN
10.0000 mL | INTRAMUSCULAR | Status: DC | PRN
  Administered 2012-01-24: 10 mL via INTRAVENOUS
  Filled 2012-01-24: qty 10

## 2012-01-24 NOTE — Progress Notes (Signed)
Tolerated flds and Neulasta 6 mg sub-q well.

## 2012-01-25 ENCOUNTER — Ambulatory Visit (HOSPITAL_COMMUNITY): Payer: 59

## 2012-01-25 ENCOUNTER — Other Ambulatory Visit (HOSPITAL_COMMUNITY): Payer: Self-pay | Admitting: Oncology

## 2012-01-30 ENCOUNTER — Other Ambulatory Visit (HOSPITAL_COMMUNITY): Payer: 59

## 2012-01-30 ENCOUNTER — Ambulatory Visit (HOSPITAL_COMMUNITY)
Admission: RE | Admit: 2012-01-30 | Discharge: 2012-01-30 | Disposition: A | Discharge status: Home / Self Care | Payer: 59 | Source: Ambulatory Visit | Attending: Oncology | Admitting: Oncology

## 2012-01-30 DIAGNOSIS — C801 Malignant (primary) neoplasm, unspecified: Secondary | ICD-10-CM | POA: Insufficient documentation

## 2012-01-30 DIAGNOSIS — C7A8 Other malignant neuroendocrine tumors: Secondary | ICD-10-CM

## 2012-01-30 DIAGNOSIS — C7951 Secondary malignant neoplasm of bone: Secondary | ICD-10-CM | POA: Insufficient documentation

## 2012-01-30 DIAGNOSIS — C7800 Secondary malignant neoplasm of unspecified lung: Secondary | ICD-10-CM | POA: Insufficient documentation

## 2012-01-30 DIAGNOSIS — C7952 Secondary malignant neoplasm of bone marrow: Secondary | ICD-10-CM | POA: Insufficient documentation

## 2012-01-30 MED ORDER — GADOBENATE DIMEGLUMINE 529 MG/ML IV SOLN
18.0000 mL | Freq: Once | INTRAVENOUS | Status: AC | PRN
  Administered 2012-01-30: 18 mL via INTRAVENOUS

## 2012-02-11 ENCOUNTER — Other Ambulatory Visit (HOSPITAL_COMMUNITY): Payer: Self-pay | Admitting: Oncology

## 2012-02-11 ENCOUNTER — Encounter (HOSPITAL_COMMUNITY): Payer: 59 | Attending: Oncology

## 2012-02-11 ENCOUNTER — Telehealth (HOSPITAL_COMMUNITY): Payer: Self-pay

## 2012-02-11 ENCOUNTER — Other Ambulatory Visit (HOSPITAL_COMMUNITY): Payer: 59

## 2012-02-11 VITALS — BP 104/67 | HR 78 | Temp 97.4°F | Wt 195.0 lb

## 2012-02-11 DIAGNOSIS — C7951 Secondary malignant neoplasm of bone: Secondary | ICD-10-CM | POA: Insufficient documentation

## 2012-02-11 DIAGNOSIS — R82998 Other abnormal findings in urine: Secondary | ICD-10-CM

## 2012-02-11 DIAGNOSIS — I2699 Other pulmonary embolism without acute cor pulmonale: Secondary | ICD-10-CM | POA: Insufficient documentation

## 2012-02-11 DIAGNOSIS — C7A090 Malignant carcinoid tumor of the bronchus and lung: Secondary | ICD-10-CM

## 2012-02-11 DIAGNOSIS — C801 Malignant (primary) neoplasm, unspecified: Secondary | ICD-10-CM | POA: Insufficient documentation

## 2012-02-11 DIAGNOSIS — C7952 Secondary malignant neoplasm of bone marrow: Secondary | ICD-10-CM | POA: Insufficient documentation

## 2012-02-11 DIAGNOSIS — C7A8 Other malignant neuroendocrine tumors: Secondary | ICD-10-CM

## 2012-02-11 DIAGNOSIS — R319 Hematuria, unspecified: Secondary | ICD-10-CM | POA: Insufficient documentation

## 2012-02-11 DIAGNOSIS — C7B8 Other secondary neuroendocrine tumors: Secondary | ICD-10-CM

## 2012-02-11 LAB — CBC
Hemoglobin: 8.5 g/dL — ABNORMAL LOW (ref 13.0–17.0)
Platelets: 92 10*3/uL — ABNORMAL LOW (ref 150–400)
RBC: 2.35 MIL/uL — ABNORMAL LOW (ref 4.22–5.81)
WBC: 3.8 10*3/uL — ABNORMAL LOW (ref 4.0–10.5)

## 2012-02-11 LAB — COMPREHENSIVE METABOLIC PANEL
ALT: 8 U/L (ref 0–53)
AST: 12 U/L (ref 0–37)
Albumin: 3.8 g/dL (ref 3.5–5.2)
CO2: 22 mEq/L (ref 19–32)
Calcium: 8.7 mg/dL (ref 8.4–10.5)
Chloride: 103 mEq/L (ref 96–112)
GFR calc non Af Amer: >90 mL/min (ref >90)
Sodium: 137 mEq/L (ref 135–145)
Total Bilirubin: 0.3 mg/dL (ref 0.3–1.2)

## 2012-02-11 LAB — DIFFERENTIAL
Lymphs Abs: 0.7 10*3/uL (ref 0.7–4.0)
Monocytes Relative: 16 % — ABNORMAL HIGH (ref 3–12)
Neutro Abs: 2.4 10*3/uL (ref 1.7–7.7)
Neutrophils Relative %: 62 % (ref 43–77)

## 2012-02-11 LAB — URINE MICROSCOPIC-ADD ON

## 2012-02-11 LAB — URINALYSIS, ROUTINE W REFLEX MICROSCOPIC
Bilirubin Urine: NEGATIVE
Glucose, UA: NEGATIVE mg/dL
Specific Gravity, Urine: 1.02 (ref 1.005–1.030)
Urobilinogen, UA: 0.2 mg/dL (ref 0.0–1.0)

## 2012-02-11 LAB — PROTIME-INR
INR: 2.81 — ABNORMAL HIGH (ref 0.00–1.49)
Prothrombin Time: 30 seconds — ABNORMAL HIGH (ref 11.6–15.2)

## 2012-02-11 MED ORDER — SODIUM CHLORIDE 0.9 % IV SOLN
INTRAVENOUS | Status: DC
  Administered 2012-02-11: 09:00:00 via INTRAVENOUS

## 2012-02-11 MED ORDER — HEPARIN SOD (PORK) LOCK FLUSH 100 UNIT/ML IV SOLN
500.0000 [IU] | Freq: Once | INTRAVENOUS | Status: AC
  Administered 2012-02-11: 500 [IU] via INTRAVENOUS
  Filled 2012-02-11: qty 5

## 2012-02-11 MED ORDER — HEPARIN SOD (PORK) LOCK FLUSH 100 UNIT/ML IV SOLN
INTRAVENOUS | Status: AC
  Filled 2012-02-11: qty 5

## 2012-02-11 MED ORDER — SULFAMETHOXAZOLE-TRIMETHOPRIM 800-160 MG PO TABS: 1.0000 | ORAL_TABLET | Freq: Two times a day (BID) | ORAL | Status: AC

## 2012-02-11 NOTE — Telephone Encounter (Signed)
Message copied by Evelena Leyden on Mon Feb 11, 2012  3:11 PM ------      Message from: Ellouise Newer III      Created: Mon Feb 11, 2012 11:16 AM       Chemotherapy held and deferred 1 week.             Same dose of Coumadin.  Will check a PT/INR next week with pre-chemo lab work.

## 2012-02-11 NOTE — Progress Notes (Signed)
Chemo defered x 1 week due to blood counts. Instruct pt to continue same dose Coumadin 5 mg daily. Verbalized understanding.

## 2012-02-11 NOTE — Telephone Encounter (Signed)
Message left and pateint instructed to continue coumadin and to return for next PT/INR on 02/18/12.  Verbalizes understanding.

## 2012-02-12 ENCOUNTER — Inpatient Hospital Stay (HOSPITAL_COMMUNITY): Payer: 59

## 2012-02-13 ENCOUNTER — Inpatient Hospital Stay (HOSPITAL_COMMUNITY): Payer: 59

## 2012-02-14 ENCOUNTER — Ambulatory Visit (HOSPITAL_COMMUNITY): Payer: 59

## 2012-02-18 ENCOUNTER — Other Ambulatory Visit (HOSPITAL_COMMUNITY): Payer: Self-pay | Admitting: Oncology

## 2012-02-18 ENCOUNTER — Encounter (HOSPITAL_BASED_OUTPATIENT_CLINIC_OR_DEPARTMENT_OTHER): Payer: 59

## 2012-02-18 VITALS — BP 108/58 | HR 69 | Temp 97.8°F

## 2012-02-18 DIAGNOSIS — C7951 Secondary malignant neoplasm of bone: Secondary | ICD-10-CM

## 2012-02-18 DIAGNOSIS — C7B8 Other secondary neuroendocrine tumors: Secondary | ICD-10-CM

## 2012-02-18 DIAGNOSIS — I2699 Other pulmonary embolism without acute cor pulmonale: Secondary | ICD-10-CM

## 2012-02-18 DIAGNOSIS — Z5111 Encounter for antineoplastic chemotherapy: Secondary | ICD-10-CM

## 2012-02-18 DIAGNOSIS — C7A090 Malignant carcinoid tumor of the bronchus and lung: Secondary | ICD-10-CM

## 2012-02-18 DIAGNOSIS — C7A8 Other malignant neuroendocrine tumors: Secondary | ICD-10-CM

## 2012-02-18 LAB — BASIC METABOLIC PANEL
CO2: 22 mEq/L (ref 19–32)
Calcium: 9.4 mg/dL (ref 8.4–10.5)
Chloride: 100 mEq/L (ref 96–112)
Glucose, Bld: 83 mg/dL (ref 70–99)
Potassium: 4.2 mEq/L (ref 3.5–5.1)
Sodium: 133 mEq/L — ABNORMAL LOW (ref 135–145)

## 2012-02-18 LAB — DIFFERENTIAL
Basophils Absolute: 0.1 10*3/uL (ref 0.0–0.1)
Lymphocytes Relative: 32 % (ref 12–46)
Lymphs Abs: 1 10*3/uL (ref 0.7–4.0)
Neutro Abs: 1.6 10*3/uL — ABNORMAL LOW (ref 1.7–7.7)
Neutrophils Relative %: 51 % (ref 43–77)

## 2012-02-18 LAB — CBC
MCV: 105.8 fL — ABNORMAL HIGH (ref 78.0–100.0)
Platelets: 122 10*3/uL — ABNORMAL LOW (ref 150–400)
RBC: 2.6 MIL/uL — ABNORMAL LOW (ref 4.22–5.81)
RDW: 17.7 % — ABNORMAL HIGH (ref 11.5–15.5)
WBC: 3.2 10*3/uL — ABNORMAL LOW (ref 4.0–10.5)

## 2012-02-18 LAB — PROTIME-INR
INR: 3.75 — ABNORMAL HIGH (ref 0.00–1.49)
Prothrombin Time: 37.6 seconds — ABNORMAL HIGH (ref 11.6–15.2)

## 2012-02-18 MED ORDER — HEPARIN SOD (PORK) LOCK FLUSH 100 UNIT/ML IV SOLN
500.0000 [IU] | Freq: Once | INTRAVENOUS | Status: AC | PRN
  Administered 2012-02-18: 500 [IU]
  Filled 2012-02-18: qty 5

## 2012-02-18 MED ORDER — PALONOSETRON HCL INJECTION 0.25 MG/5ML
INTRAVENOUS | Status: AC
  Filled 2012-02-18: qty 5

## 2012-02-18 MED ORDER — SODIUM CHLORIDE 0.9 % IV SOLN
INTRAVENOUS | Status: AC
  Administered 2012-02-18: 10:00:00 via INTRAVENOUS

## 2012-02-18 MED ORDER — SODIUM CHLORIDE 0.9 % IJ SOLN
INTRAMUSCULAR | Status: AC
  Filled 2012-02-18: qty 10

## 2012-02-18 MED ORDER — HEPARIN SOD (PORK) LOCK FLUSH 100 UNIT/ML IV SOLN
INTRAVENOUS | Status: AC
  Filled 2012-02-18: qty 5

## 2012-02-18 MED ORDER — DEXAMETHASONE SODIUM PHOSPHATE 4 MG/ML IJ SOLN: 12.0000 mg | Freq: Once | INTRAMUSCULAR | Status: DC

## 2012-02-18 MED ORDER — SODIUM CHLORIDE 0.9 % IV SOLN: 150.0000 mg | Freq: Once | INTRAVENOUS | Status: DC

## 2012-02-18 MED ORDER — SODIUM CHLORIDE 0.9 % IJ SOLN
10.0000 mL | INTRAMUSCULAR | Status: DC | PRN
  Administered 2012-02-18: 10 mL
  Filled 2012-02-18: qty 10

## 2012-02-18 MED ORDER — SODIUM CHLORIDE 0.9 % IV SOLN
20.0000 mg/m2 | Freq: Once | INTRAVENOUS | Status: AC
  Administered 2012-02-18: 44 mg via INTRAVENOUS
  Filled 2012-02-18: qty 44

## 2012-02-18 MED ORDER — SODIUM CHLORIDE 0.9 % IV SOLN
100.0000 mg/m2 | Freq: Once | INTRAVENOUS | Status: AC
  Administered 2012-02-18: 220 mg via INTRAVENOUS
  Filled 2012-02-18: qty 11

## 2012-02-18 MED ORDER — PALONOSETRON HCL INJECTION 0.25 MG/5ML
0.2500 mg | Freq: Once | INTRAVENOUS | Status: AC
  Administered 2012-02-18: 0.25 mg via INTRAVENOUS

## 2012-02-18 MED ORDER — LOMUSTINE 40 MG PO CAPS
80.0000 mg | ORAL_CAPSULE | Freq: Once | ORAL | Status: AC
  Administered 2012-02-18: 80 mg via ORAL
  Filled 2012-02-18: qty 2

## 2012-02-18 MED ORDER — SODIUM CHLORIDE 0.9 % IV SOLN
Freq: Once | INTRAVENOUS | Status: AC
  Administered 2012-02-18: 10:00:00 via INTRAVENOUS
  Filled 2012-02-18: qty 5

## 2012-02-18 NOTE — Progress Notes (Signed)
Tolerated chemo well.  Port access drsg reinforced and ends capped.

## 2012-02-19 ENCOUNTER — Encounter (HOSPITAL_BASED_OUTPATIENT_CLINIC_OR_DEPARTMENT_OTHER): Payer: 59

## 2012-02-19 VITALS — BP 110/65 | HR 67 | Temp 97.6°F | Wt 199.6 lb

## 2012-02-19 DIAGNOSIS — C7A8 Other malignant neuroendocrine tumors: Secondary | ICD-10-CM

## 2012-02-19 DIAGNOSIS — C7951 Secondary malignant neoplasm of bone: Secondary | ICD-10-CM

## 2012-02-19 DIAGNOSIS — R319 Hematuria, unspecified: Secondary | ICD-10-CM

## 2012-02-19 DIAGNOSIS — C7A090 Malignant carcinoid tumor of the bronchus and lung: Secondary | ICD-10-CM

## 2012-02-19 DIAGNOSIS — C7B8 Other secondary neuroendocrine tumors: Secondary | ICD-10-CM

## 2012-02-19 DIAGNOSIS — Z5111 Encounter for antineoplastic chemotherapy: Secondary | ICD-10-CM

## 2012-02-19 LAB — URINE MICROSCOPIC-ADD ON

## 2012-02-19 LAB — URINALYSIS, ROUTINE W REFLEX MICROSCOPIC
Bilirubin Urine: NEGATIVE
Ketones, ur: NEGATIVE mg/dL
Specific Gravity, Urine: 1.02 (ref 1.005–1.030)
pH: 6 (ref 5.0–8.0)

## 2012-02-19 MED ORDER — SODIUM CHLORIDE 0.9 % IV SOLN
INTRAVENOUS | Status: AC
  Administered 2012-02-19: 10:00:00 via INTRAVENOUS

## 2012-02-19 MED ORDER — HEPARIN SOD (PORK) LOCK FLUSH 100 UNIT/ML IV SOLN
500.0000 [IU] | Freq: Once | INTRAVENOUS | Status: AC | PRN
  Administered 2012-02-19: 500 [IU]
  Filled 2012-02-19: qty 5

## 2012-02-19 MED ORDER — SODIUM CHLORIDE 0.9 % IV SOLN
100.0000 mg/m2 | Freq: Once | INTRAVENOUS | Status: AC
  Administered 2012-02-19: 220 mg via INTRAVENOUS
  Filled 2012-02-19: qty 11

## 2012-02-19 MED ORDER — SODIUM CHLORIDE 0.9 % IJ SOLN
10.0000 mL | INTRAMUSCULAR | Status: DC | PRN
  Administered 2012-02-19: 10 mL
  Filled 2012-02-19: qty 10

## 2012-02-19 MED ORDER — SODIUM CHLORIDE 0.9 % IJ SOLN
INTRAMUSCULAR | Status: AC
  Filled 2012-02-19: qty 10

## 2012-02-19 MED ORDER — HEPARIN SOD (PORK) LOCK FLUSH 100 UNIT/ML IV SOLN
INTRAVENOUS | Status: AC
  Filled 2012-02-19: qty 5

## 2012-02-19 MED ORDER — SODIUM CHLORIDE 0.9 % IV SOLN
20.0000 mg/m2 | Freq: Once | INTRAVENOUS | Status: AC
  Administered 2012-02-19: 44 mg via INTRAVENOUS
  Filled 2012-02-19: qty 44

## 2012-02-19 MED ORDER — DEXAMETHASONE SODIUM PHOSPHATE 10 MG/ML IJ SOLN: 10.0000 mg | Freq: Once | INTRAMUSCULAR | Status: DC

## 2012-02-19 MED ORDER — SODIUM CHLORIDE 0.9 % IV SOLN
10.0000 mg | Freq: Once | INTRAVENOUS | Status: AC
  Administered 2012-02-19: 10 mg via INTRAVENOUS
  Filled 2012-02-19: qty 1

## 2012-02-19 NOTE — Progress Notes (Signed)
Tolerated chemo well. 

## 2012-02-20 ENCOUNTER — Other Ambulatory Visit (HOSPITAL_COMMUNITY): Payer: Self-pay | Admitting: Oncology

## 2012-02-20 ENCOUNTER — Encounter (HOSPITAL_BASED_OUTPATIENT_CLINIC_OR_DEPARTMENT_OTHER): Payer: 59

## 2012-02-20 ENCOUNTER — Encounter (HOSPITAL_COMMUNITY): Payer: 59

## 2012-02-20 VITALS — BP 125/86 | HR 64 | Temp 97.5°F | Ht 74.0 in | Wt 200.0 lb

## 2012-02-20 DIAGNOSIS — C7A8 Other malignant neuroendocrine tumors: Secondary | ICD-10-CM

## 2012-02-20 DIAGNOSIS — C7B8 Other secondary neuroendocrine tumors: Secondary | ICD-10-CM

## 2012-02-20 DIAGNOSIS — C7951 Secondary malignant neoplasm of bone: Secondary | ICD-10-CM

## 2012-02-20 DIAGNOSIS — I2699 Other pulmonary embolism without acute cor pulmonale: Secondary | ICD-10-CM

## 2012-02-20 DIAGNOSIS — C7A090 Malignant carcinoid tumor of the bronchus and lung: Secondary | ICD-10-CM

## 2012-02-20 DIAGNOSIS — Z5111 Encounter for antineoplastic chemotherapy: Secondary | ICD-10-CM

## 2012-02-20 LAB — PROTIME-INR
INR: 3.07 — ABNORMAL HIGH (ref 0.00–1.49)
Prothrombin Time: 32.2 seconds — ABNORMAL HIGH (ref 11.6–15.2)

## 2012-02-20 MED ORDER — DEXAMETHASONE SODIUM PHOSPHATE 10 MG/ML IJ SOLN: 10.0000 mg | Freq: Once | INTRAMUSCULAR | Status: DC

## 2012-02-20 MED ORDER — SODIUM CHLORIDE 0.9 % IV SOLN
100.0000 mg/m2 | Freq: Once | INTRAVENOUS | Status: AC
  Administered 2012-02-20: 220 mg via INTRAVENOUS
  Filled 2012-02-20: qty 11

## 2012-02-20 MED ORDER — HEPARIN SOD (PORK) LOCK FLUSH 100 UNIT/ML IV SOLN
500.0000 [IU] | Freq: Once | INTRAVENOUS | Status: DC | PRN
  Filled 2012-02-20: qty 5

## 2012-02-20 MED ORDER — SODIUM CHLORIDE 0.9 % IV SOLN
10.0000 mg | Freq: Once | INTRAVENOUS | Status: AC
  Administered 2012-02-20: 10 mg via INTRAVENOUS
  Filled 2012-02-20: qty 1

## 2012-02-20 MED ORDER — HEPARIN SOD (PORK) LOCK FLUSH 100 UNIT/ML IV SOLN
INTRAVENOUS | Status: AC
  Filled 2012-02-20: qty 5

## 2012-02-20 MED ORDER — SODIUM CHLORIDE 0.9 % IV SOLN
INTRAVENOUS | Status: AC
  Administered 2012-02-20: 1000 mL via INTRAVENOUS

## 2012-02-20 MED ORDER — SODIUM CHLORIDE 0.9 % IV SOLN
20.0000 mg/m2 | Freq: Once | INTRAVENOUS | Status: AC
  Administered 2012-02-20: 44 mg via INTRAVENOUS
  Filled 2012-02-20: qty 44

## 2012-02-20 MED ORDER — SODIUM CHLORIDE 0.9 % IV SOLN
INTRAVENOUS | Status: AC
  Administered 2012-02-20: 10:00:00 via INTRAVENOUS

## 2012-02-20 MED ORDER — SODIUM CHLORIDE 0.9 % IJ SOLN
10.0000 mL | INTRAMUSCULAR | Status: DC | PRN
  Filled 2012-02-20: qty 10

## 2012-02-20 NOTE — Progress Notes (Signed)
Tolerated well. No further urinary symptoms today. Coumadin instructions given per Tomasita Morrow RN.

## 2012-02-20 NOTE — Progress Notes (Signed)
Labs drawn today for pt.  Patient held coud for 2 days. Call patient at [phone]

## 2012-02-21 ENCOUNTER — Encounter (HOSPITAL_BASED_OUTPATIENT_CLINIC_OR_DEPARTMENT_OTHER): Payer: 59

## 2012-02-21 ENCOUNTER — Encounter (HOSPITAL_COMMUNITY): Payer: 59

## 2012-02-21 VITALS — BP 112/56 | HR 63 | Temp 97.4°F

## 2012-02-21 DIAGNOSIS — C7B8 Other secondary neuroendocrine tumors: Secondary | ICD-10-CM

## 2012-02-21 DIAGNOSIS — C7952 Secondary malignant neoplasm of bone marrow: Secondary | ICD-10-CM

## 2012-02-21 DIAGNOSIS — C7A090 Malignant carcinoid tumor of the bronchus and lung: Secondary | ICD-10-CM

## 2012-02-21 DIAGNOSIS — C7951 Secondary malignant neoplasm of bone: Secondary | ICD-10-CM

## 2012-02-21 DIAGNOSIS — Z5189 Encounter for other specified aftercare: Secondary | ICD-10-CM

## 2012-02-21 DIAGNOSIS — C7A8 Other malignant neuroendocrine tumors: Secondary | ICD-10-CM

## 2012-02-21 DIAGNOSIS — C801 Malignant (primary) neoplasm, unspecified: Secondary | ICD-10-CM

## 2012-02-21 LAB — URINE CULTURE
Culture  Setup Time: 201306190155
Culture: NO GROWTH

## 2012-02-21 MED ORDER — PEGFILGRASTIM INJECTION 6 MG/0.6ML
SUBCUTANEOUS | Status: AC
  Filled 2012-02-21: qty 0.6

## 2012-02-21 MED ORDER — DENOSUMAB 120 MG/1.7ML ~~LOC~~ SOLN
120.0000 mg | Freq: Once | SUBCUTANEOUS | Status: AC
  Administered 2012-02-21: 120 mg via SUBCUTANEOUS
  Filled 2012-02-21: qty 1.7

## 2012-02-21 MED ORDER — HEPARIN SOD (PORK) LOCK FLUSH 100 UNIT/ML IV SOLN
INTRAVENOUS | Status: AC
  Filled 2012-02-21: qty 5

## 2012-02-21 MED ORDER — SODIUM CHLORIDE 0.9 % IV SOLN
INTRAVENOUS | Status: DC
  Administered 2012-02-21: 09:00:00 via INTRAVENOUS

## 2012-02-21 MED ORDER — PEGFILGRASTIM INJECTION 6 MG/0.6ML
6.0000 mg | Freq: Once | SUBCUTANEOUS | Status: AC
  Administered 2012-02-21: 6 mg via SUBCUTANEOUS

## 2012-02-21 MED ORDER — SODIUM CHLORIDE 0.9 % IJ SOLN
INTRAMUSCULAR | Status: AC
  Filled 2012-02-21: qty 10

## 2012-02-21 NOTE — Progress Notes (Signed)
[name] Conner presents today for injection per MD orders. xgeva 120 mg administered SQ in right Abdomen. Administration without incident. Patient tolerated well. [name] Conner presents today for injection per MD orders. Neulasta 6mg  administered SQ in left Abdomen. Administration without incident. Patient tolerated well.

## 2012-02-21 NOTE — Progress Notes (Signed)
See infusion appt.

## 2012-02-25 ENCOUNTER — Other Ambulatory Visit (HOSPITAL_COMMUNITY): Payer: Self-pay | Admitting: Oncology

## 2012-02-25 ENCOUNTER — Encounter (HOSPITAL_COMMUNITY): Payer: 59

## 2012-02-25 DIAGNOSIS — I2699 Other pulmonary embolism without acute cor pulmonale: Secondary | ICD-10-CM

## 2012-02-25 DIAGNOSIS — C7951 Secondary malignant neoplasm of bone: Secondary | ICD-10-CM

## 2012-02-25 DIAGNOSIS — C7A8 Other malignant neuroendocrine tumors: Secondary | ICD-10-CM

## 2012-02-25 LAB — PROTIME-INR: INR: 3.23 — ABNORMAL HIGH (ref 0.00–1.49)

## 2012-02-25 NOTE — Progress Notes (Unsigned)
[name] Conner presented for labwork. Labs per MD order drawn via Peripheral Line 23 gauge needle inserted in right  Good blood return present. Procedure without incident.  Needle removed intact. Patient tolerated procedure well.

## 2012-02-26 ENCOUNTER — Telehealth (HOSPITAL_COMMUNITY): Payer: Self-pay | Admitting: Oncology

## 2012-02-26 NOTE — Telephone Encounter (Signed)
[phone] ? IF XGEVA REQUIRES PRE AUTH PER TAMARA N. AUTH IS NOT REQUIRED

## 2012-02-29 ENCOUNTER — Other Ambulatory Visit (HOSPITAL_COMMUNITY): Payer: Self-pay | Admitting: Oncology

## 2012-02-29 ENCOUNTER — Encounter (HOSPITAL_BASED_OUTPATIENT_CLINIC_OR_DEPARTMENT_OTHER): Payer: 59

## 2012-02-29 DIAGNOSIS — I2699 Other pulmonary embolism without acute cor pulmonale: Secondary | ICD-10-CM

## 2012-02-29 LAB — PROTIME-INR: Prothrombin Time: 27.6 seconds — ABNORMAL HIGH (ref 11.6–15.2)

## 2012-03-07 ENCOUNTER — Encounter (HOSPITAL_COMMUNITY): Payer: 59 | Attending: Oncology

## 2012-03-07 ENCOUNTER — Encounter (HOSPITAL_BASED_OUTPATIENT_CLINIC_OR_DEPARTMENT_OTHER): Payer: 59 | Admitting: Oncology

## 2012-03-07 VITALS — BP 77/54 | HR 115 | Temp 97.8°F

## 2012-03-07 DIAGNOSIS — C7B8 Other secondary neuroendocrine tumors: Secondary | ICD-10-CM

## 2012-03-07 DIAGNOSIS — R569 Unspecified convulsions: Secondary | ICD-10-CM | POA: Insufficient documentation

## 2012-03-07 DIAGNOSIS — C7952 Secondary malignant neoplasm of bone marrow: Secondary | ICD-10-CM | POA: Insufficient documentation

## 2012-03-07 DIAGNOSIS — I2699 Other pulmonary embolism without acute cor pulmonale: Secondary | ICD-10-CM | POA: Insufficient documentation

## 2012-03-07 DIAGNOSIS — C7A090 Malignant carcinoid tumor of the bronchus and lung: Secondary | ICD-10-CM

## 2012-03-07 DIAGNOSIS — R3989 Other symptoms and signs involving the genitourinary system: Secondary | ICD-10-CM | POA: Insufficient documentation

## 2012-03-07 DIAGNOSIS — R5381 Other malaise: Secondary | ICD-10-CM | POA: Insufficient documentation

## 2012-03-07 DIAGNOSIS — C7A8 Other malignant neuroendocrine tumors: Secondary | ICD-10-CM

## 2012-03-07 DIAGNOSIS — R399 Unspecified symptoms and signs involving the genitourinary system: Secondary | ICD-10-CM

## 2012-03-07 DIAGNOSIS — C7951 Secondary malignant neoplasm of bone: Secondary | ICD-10-CM | POA: Insufficient documentation

## 2012-03-07 DIAGNOSIS — C801 Malignant (primary) neoplasm, unspecified: Secondary | ICD-10-CM | POA: Insufficient documentation

## 2012-03-07 DIAGNOSIS — R531 Weakness: Secondary | ICD-10-CM

## 2012-03-07 LAB — CBC
HCT: 25.6 % — ABNORMAL LOW (ref 39.0–52.0)
MCH: 36.7 pg — ABNORMAL HIGH (ref 26.0–34.0)
MCHC: 35.2 g/dL (ref 30.0–36.0)
RDW: 16.7 % — ABNORMAL HIGH (ref 11.5–15.5)

## 2012-03-07 LAB — URINALYSIS, ROUTINE W REFLEX MICROSCOPIC
Bilirubin Urine: NEGATIVE
Ketones, ur: NEGATIVE mg/dL
Nitrite: NEGATIVE
Protein, ur: 30 mg/dL — AB
Urobilinogen, UA: 0.2 mg/dL (ref 0.0–1.0)
pH: 6 (ref 5.0–8.0)

## 2012-03-07 LAB — BASIC METABOLIC PANEL
BUN: 16 mg/dL (ref 6–23)
Calcium: 7.6 mg/dL — ABNORMAL LOW (ref 8.4–10.5)
Chloride: 93 mEq/L — ABNORMAL LOW (ref 96–112)
Creatinine, Ser: 1.44 mg/dL — ABNORMAL HIGH (ref 0.50–1.35)
GFR calc Af Amer: 64 mL/min — ABNORMAL LOW (ref >90)
GFR calc non Af Amer: 55 mL/min — ABNORMAL LOW (ref >90)

## 2012-03-07 LAB — DIFFERENTIAL
Basophils Absolute: 0 10*3/uL (ref 0.0–0.1)
Basophils Relative: 0 % (ref 0–1)
Eosinophils Absolute: 0 10*3/uL (ref 0.0–0.7)
Monocytes Absolute: 1 10*3/uL (ref 0.1–1.0)
Neutro Abs: 6 10*3/uL (ref 1.7–7.7)

## 2012-03-07 LAB — PROTIME-INR
INR: 1.6 — ABNORMAL HIGH (ref 0.00–1.49)
Prothrombin Time: 19.3 seconds — ABNORMAL HIGH (ref 11.6–15.2)

## 2012-03-07 NOTE — Patient Instructions (Addendum)
KALEB SEK  [id number] [birth year] Dr. Glenford Peers   [facility]  Discharge Instructions  RECOMMENDATIONS MADE BY THE CONSULTANT AND ANY TEST RESULTS WILL BE SENT TO YOUR REFERRING DOCTOR.   EXAM FINDINGS BY MD TODAY AND SIGNS AND SYMPTOMS TO REPORT TO CLINIC OR PRIMARY MD: need to increase your fluid intake.  Increase you coumadin dosage to 5 mg alternating with 4 mg.  Increase your Hydrocortisone or Cortisone to 2 pills twice daily and let us know how things are going on Monday.  MEDICATIONS PRESCRIBED: as above   SPECIAL INSTRUCTIONS/FOLLOW-UP: Lab work Needed next Friday and Return to Clinic on as scheduled.   I acknowledge that I have been informed and understand all the instructions given to me and received a copy. I do not have any more questions at this time, but understand that I may call the Specialty Clinic at [facility] at [phone] during business hours should I have any further questions or need assistance in obtaining follow-up care.    __________________________________________  _____________  __________ Signature of Patient or Authorized Representative            Date                   Time    __________________________________________ Nurse's Signature

## 2012-03-07 NOTE — Progress Notes (Signed)
In for PT level and for last couple of days has noticed that urine has a lot of mucus and some "brown spots" in it.  Also experiencing some discomfort in lower abdomen and bilateral flank areas,nausea with vomiting x 1 and increased weakness.  Discussed with Dr. Mariel Sleet and orders received.    [name] Conner presented for labwork. Labs per MD order drawn via Peripheral Line 23auge needle inserted in left AC (CBC/DIFF, BMET, PT/INR) Good blood return present. Procedure without incident.  Needle removed intact. Patient tolerated procedure well.  BP 77/54 HR 115 R 16 T 97.8

## 2012-03-07 NOTE — Progress Notes (Signed)
Problem #1 metastatic neuroendocrine carcinoma of the lung to multiple areas including brain.  Problem #2 pulmonary embolus on Coumadin.  Problem #3 weakness and fatigue. He comes in today with progressive weakness and fatigue and had to be work then. His sodium is slightly low potassium is okay. Blood pressure slightly on the low side. He cannot remember the strength of his hydrocortisone but it turns out that his hydrocortisone is 5 mg twice a day. We've called and cortisone acetate but it appears that the pharmacy substitute hydrocortisone but not at the correct dose. He should been on 20 mg twice a day to correspond with a cortisone acetate dose of 25 mg twice a day. So his wife went home and all of his medications meds that we found this out. We will therefore increase his hydrocortisone to 40 mg twice a day starting today and through the weekend. It's been said that the pharmacy cannot get the cortisone acetate so we will change on Monday to hydrocortisone 20 mg twice a day. This should help him tremendously. If he is not better his wife will call us

## 2012-03-14 ENCOUNTER — Other Ambulatory Visit (HOSPITAL_COMMUNITY): Payer: Self-pay | Admitting: Oncology

## 2012-03-14 ENCOUNTER — Encounter (HOSPITAL_BASED_OUTPATIENT_CLINIC_OR_DEPARTMENT_OTHER): Payer: 59

## 2012-03-14 DIAGNOSIS — R569 Unspecified convulsions: Secondary | ICD-10-CM

## 2012-03-14 DIAGNOSIS — I2699 Other pulmonary embolism without acute cor pulmonale: Secondary | ICD-10-CM

## 2012-03-14 LAB — PROTIME-INR
INR: 3.85 — ABNORMAL HIGH (ref 0.00–1.49)
Prothrombin Time: 38.4 seconds — ABNORMAL HIGH (ref 11.6–15.2)

## 2012-03-17 ENCOUNTER — Other Ambulatory Visit (HOSPITAL_COMMUNITY): Payer: Self-pay | Admitting: Oncology

## 2012-03-17 ENCOUNTER — Encounter (HOSPITAL_BASED_OUTPATIENT_CLINIC_OR_DEPARTMENT_OTHER): Payer: 59

## 2012-03-17 DIAGNOSIS — I2699 Other pulmonary embolism without acute cor pulmonale: Secondary | ICD-10-CM

## 2012-03-17 NOTE — Progress Notes (Signed)
Labs drawn today for pt.  Patient has had no coud since Friday.  Call patient at [phone]

## 2012-03-20 ENCOUNTER — Ambulatory Visit (HOSPITAL_COMMUNITY): Payer: 59

## 2012-03-21 ENCOUNTER — Other Ambulatory Visit (HOSPITAL_COMMUNITY): Payer: Self-pay | Admitting: Oncology

## 2012-03-21 ENCOUNTER — Encounter (HOSPITAL_BASED_OUTPATIENT_CLINIC_OR_DEPARTMENT_OTHER): Payer: 59

## 2012-03-21 ENCOUNTER — Encounter (HOSPITAL_COMMUNITY): Payer: 59

## 2012-03-21 VITALS — BP 113/77 | HR 91 | Temp 98.4°F

## 2012-03-21 DIAGNOSIS — I2699 Other pulmonary embolism without acute cor pulmonale: Secondary | ICD-10-CM

## 2012-03-21 DIAGNOSIS — C7951 Secondary malignant neoplasm of bone: Secondary | ICD-10-CM

## 2012-03-21 DIAGNOSIS — C7B8 Other secondary neuroendocrine tumors: Secondary | ICD-10-CM

## 2012-03-21 DIAGNOSIS — C7A090 Malignant carcinoid tumor of the bronchus and lung: Secondary | ICD-10-CM

## 2012-03-21 LAB — COMPREHENSIVE METABOLIC PANEL
Albumin: 3.8 g/dL (ref 3.5–5.2)
BUN: 9 mg/dL (ref 6–23)
Chloride: 102 mEq/L (ref 96–112)
Creatinine, Ser: 0.97 mg/dL (ref 0.50–1.35)
GFR calc Af Amer: >90 mL/min (ref >90)
Glucose, Bld: 115 mg/dL — ABNORMAL HIGH (ref 70–99)
Total Bilirubin: 0.3 mg/dL (ref 0.3–1.2)

## 2012-03-21 MED ORDER — DENOSUMAB 120 MG/1.7ML ~~LOC~~ SOLN: 120.0000 mg | Freq: Once | SUBCUTANEOUS | Status: DC

## 2012-03-21 NOTE — Progress Notes (Signed)
Labs drawn today for pt and cmp.  Patient on 4,4 and 5mg  alternating.  Call patient at [phone]

## 2012-03-21 NOTE — Progress Notes (Signed)
Xgeva held today until repeat CMET done. Pt rescheduled for injection 03/28/12.

## 2012-03-28 ENCOUNTER — Encounter (HOSPITAL_COMMUNITY): Payer: 59

## 2012-03-28 ENCOUNTER — Encounter (HOSPITAL_BASED_OUTPATIENT_CLINIC_OR_DEPARTMENT_OTHER): Payer: 59

## 2012-03-28 VITALS — BP 119/78 | HR 90

## 2012-03-28 DIAGNOSIS — C7A090 Malignant carcinoid tumor of the bronchus and lung: Secondary | ICD-10-CM

## 2012-03-28 DIAGNOSIS — C7B8 Other secondary neuroendocrine tumors: Secondary | ICD-10-CM

## 2012-03-28 DIAGNOSIS — C7951 Secondary malignant neoplasm of bone: Secondary | ICD-10-CM

## 2012-03-28 LAB — PROTIME-INR
INR: 2.53 — ABNORMAL HIGH (ref 0.00–1.49)
Prothrombin Time: 27.7 seconds — ABNORMAL HIGH (ref 11.6–15.2)

## 2012-03-28 MED ORDER — WARFARIN SODIUM 2 MG PO TABS: 2.0000 mg | ORAL_TABLET | Freq: Every day | ORAL | Status: DC

## 2012-03-28 MED ORDER — DENOSUMAB 120 MG/1.7ML ~~LOC~~ SOLN
120.0000 mg | Freq: Once | SUBCUTANEOUS | Status: AC
  Administered 2012-03-28: 120 mg via SUBCUTANEOUS
  Filled 2012-03-28: qty 1.7

## 2012-03-28 NOTE — Addendum Note (Signed)
Addended by: Evelena Leyden on: 03/28/2012 06:20 PM   Modules accepted: Orders

## 2012-03-28 NOTE — Progress Notes (Signed)
Tolerated injection well. 

## 2012-03-28 NOTE — Progress Notes (Signed)
Labs drawn today for pt 

## 2012-04-03 ENCOUNTER — Telehealth (HOSPITAL_COMMUNITY): Payer: Self-pay

## 2012-04-03 ENCOUNTER — Encounter (HOSPITAL_COMMUNITY): Payer: 59 | Attending: Oncology

## 2012-04-03 ENCOUNTER — Other Ambulatory Visit (HOSPITAL_COMMUNITY): Payer: Self-pay | Admitting: Oncology

## 2012-04-03 DIAGNOSIS — I2699 Other pulmonary embolism without acute cor pulmonale: Secondary | ICD-10-CM

## 2012-04-03 DIAGNOSIS — C801 Malignant (primary) neoplasm, unspecified: Secondary | ICD-10-CM | POA: Insufficient documentation

## 2012-04-03 DIAGNOSIS — C7951 Secondary malignant neoplasm of bone: Secondary | ICD-10-CM | POA: Insufficient documentation

## 2012-04-03 MED ORDER — SODIUM CHLORIDE 0.9 % IJ SOLN
10.0000 mL | INTRAMUSCULAR | Status: DC | PRN
  Administered 2012-04-03: 10 mL via INTRAVENOUS
  Filled 2012-04-03: qty 10

## 2012-04-03 MED ORDER — HEPARIN SOD (PORK) LOCK FLUSH 100 UNIT/ML IV SOLN
500.0000 [IU] | Freq: Once | INTRAVENOUS | Status: AC
  Administered 2012-04-03: 500 [IU] via INTRAVENOUS
  Filled 2012-04-03: qty 5

## 2012-04-03 MED ORDER — SODIUM CHLORIDE 0.9 % IJ SOLN
INTRAMUSCULAR | Status: AC
  Filled 2012-04-03: qty 20

## 2012-04-03 MED ORDER — HEPARIN SOD (PORK) LOCK FLUSH 100 UNIT/ML IV SOLN
INTRAVENOUS | Status: AC
  Filled 2012-04-03: qty 5

## 2012-04-03 NOTE — Progress Notes (Signed)
[name] Conner presented for Portacath access and flush. Proper placement of portacath confirmed by CXR. Portacath located lt chest wall accessed with  H 20 needle. Good blood return present. PT inr drawn. Pt out of coumadin since 7/29. Took 3 mg on 7/28 Portacath flushed with 20ml NS and 500U/69ml Heparin and needle removed intact. Procedure without incident. Patient tolerated procedure well.   1400 coumadin 4 mg tabs # 30 called to [facility] and coumadin 1 mg tabs #30 also . Pt informed to go back on coumadin 4/4/5.

## 2012-04-03 NOTE — Telephone Encounter (Signed)
Patient called and message left for him to continue original dose of Coumadin and to return next Friday to repeat lab work.

## 2012-04-10 ENCOUNTER — Other Ambulatory Visit (HOSPITAL_COMMUNITY): Payer: Self-pay | Admitting: Oncology

## 2012-04-10 ENCOUNTER — Ambulatory Visit (HOSPITAL_COMMUNITY)
Admission: RE | Admit: 2012-04-10 | Discharge: 2012-04-10 | Disposition: A | Discharge status: Home / Self Care | Payer: 59 | Source: Ambulatory Visit | Attending: Oncology | Admitting: Oncology

## 2012-04-10 ENCOUNTER — Encounter (HOSPITAL_BASED_OUTPATIENT_CLINIC_OR_DEPARTMENT_OTHER): Payer: 59

## 2012-04-10 DIAGNOSIS — C3490 Malignant neoplasm of unspecified part of unspecified bronchus or lung: Secondary | ICD-10-CM | POA: Insufficient documentation

## 2012-04-10 DIAGNOSIS — C7951 Secondary malignant neoplasm of bone: Secondary | ICD-10-CM | POA: Insufficient documentation

## 2012-04-10 DIAGNOSIS — C7A8 Other malignant neuroendocrine tumors: Secondary | ICD-10-CM

## 2012-04-10 DIAGNOSIS — I2699 Other pulmonary embolism without acute cor pulmonale: Secondary | ICD-10-CM

## 2012-04-10 LAB — PROTIME-INR: INR: 2.34 — ABNORMAL HIGH (ref 0.00–1.49)

## 2012-04-10 MED ORDER — IOHEXOL 300 MG/ML  SOLN
100.0000 mL | Freq: Once | INTRAMUSCULAR | Status: AC | PRN
  Administered 2012-04-10: 100 mL via INTRAVENOUS

## 2012-04-10 NOTE — Progress Notes (Signed)
Labs drawn today for pt 

## 2012-04-11 ENCOUNTER — Other Ambulatory Visit (HOSPITAL_COMMUNITY): Payer: 59

## 2012-04-17 ENCOUNTER — Encounter (HOSPITAL_BASED_OUTPATIENT_CLINIC_OR_DEPARTMENT_OTHER): Payer: 59

## 2012-04-17 ENCOUNTER — Other Ambulatory Visit (HOSPITAL_COMMUNITY): Payer: Self-pay | Admitting: Oncology

## 2012-04-17 DIAGNOSIS — I2699 Other pulmonary embolism without acute cor pulmonale: Secondary | ICD-10-CM

## 2012-04-17 LAB — PROTIME-INR
INR: 3.63 — ABNORMAL HIGH (ref 0.00–1.49)
Prothrombin Time: 36.7 seconds — ABNORMAL HIGH (ref 11.6–15.2)

## 2012-04-17 NOTE — Progress Notes (Signed)
Labs drawn today for pt 

## 2012-04-21 ENCOUNTER — Other Ambulatory Visit (HOSPITAL_COMMUNITY): Payer: Self-pay | Admitting: Oncology

## 2012-04-21 ENCOUNTER — Encounter (HOSPITAL_BASED_OUTPATIENT_CLINIC_OR_DEPARTMENT_OTHER): Payer: 59

## 2012-04-21 DIAGNOSIS — I2699 Other pulmonary embolism without acute cor pulmonale: Secondary | ICD-10-CM

## 2012-04-21 LAB — PROTIME-INR: Prothrombin Time: 18.7 seconds — ABNORMAL HIGH (ref 11.6–15.2)

## 2012-04-21 NOTE — Progress Notes (Signed)
Labs drawn today for pt 

## 2012-04-22 ENCOUNTER — Encounter (HOSPITAL_BASED_OUTPATIENT_CLINIC_OR_DEPARTMENT_OTHER): Payer: 59 | Admitting: Oncology

## 2012-04-22 VITALS — BP 125/76 | HR 76 | Temp 97.8°F | Resp 18 | Ht 74.0 in | Wt 199.1 lb

## 2012-04-22 DIAGNOSIS — C787 Secondary malignant neoplasm of liver and intrahepatic bile duct: Secondary | ICD-10-CM

## 2012-04-22 DIAGNOSIS — C7A090 Malignant carcinoid tumor of the bronchus and lung: Secondary | ICD-10-CM

## 2012-04-22 DIAGNOSIS — C7B8 Other secondary neuroendocrine tumors: Secondary | ICD-10-CM

## 2012-04-22 DIAGNOSIS — C7951 Secondary malignant neoplasm of bone: Secondary | ICD-10-CM

## 2012-04-22 DIAGNOSIS — C7A8 Other malignant neuroendocrine tumors: Secondary | ICD-10-CM

## 2012-04-22 MED ORDER — OXYCODONE HCL 5 MG PO TABS: ORAL_TABLET | ORAL | Status: AC

## 2012-04-22 MED ORDER — FLUCONAZOLE 100 MG PO TABS: 100.0000 mg | ORAL_TABLET | Freq: Every day | ORAL | Status: DC

## 2012-04-22 NOTE — Patient Instructions (Addendum)
[name] Conner  DOB 06/02/1961 CSN [id number]  MRN [id number] Dr. Glenford Peers   [facility]  Discharge Instructions  RECOMMENDATIONS MADE BY THE CONSULTANT AND ANY TEST RESULTS WILL BE SENT TO YOUR REFERRING DOCTOR.   EXAM FINDINGS BY MD TODAY AND SIGNS AND SYMPTOMS TO REPORT TO CLINIC OR PRIMARY MD: Discussion per MD.  Report uncontrolled pain, increased shortness of breath, confusion or other problems  MEDICATIONS PRESCRIBED: refill for Oxycodone Follow label directions  INSTRUCTIONS GIVEN AND DISCUSSED:   SPECIAL INSTRUCTIONS/FOLLOW-UP: Other (Referral/Appointments) :  See schedule for port flushes, blood work, MR of brain, CT scans of chest abdomen and pelvis and for plain films of right femur.  To see MD in October for follow-up.   I acknowledge that I have been informed and understand all the instructions given to me and received a copy. I do not have any more questions at this time, but understand that I may call the Specialty Clinic at [facility] at [phone] during business hours should I have any further questions or need assistance in obtaining follow-up care.    __________________________________________  _____________  __________ Signature of Patient or Authorized Representative            Date                   Time    __________________________________________ Nurse's Signature

## 2012-04-22 NOTE — Progress Notes (Signed)
Problem #1 metastatic neuroendocrine carcinoma lung to multiple areas including bone liver brain. He is status post surgical resection of the brain lesion followed by radiation therapy then followed by chemotherapy x8 cycles. His last cycle of chemotherapy was in June 2013. He is here today for followup having just completed the CAT scans of his chest abdomen and pelvis. All is still very much improved other than he has one single liver lesion that has gone from 5.8 mm to 11.2 mm in the left lobe of the liver. There no other new lesions anywhere in the chest looks extremely stable. He is alert he is oriented he is accompanied by his wife today he makes complete sense when he is talking. He is not having seizures and his last Keppra level was perfectly therapeutic. His pain is well-controlled he are the every uses something for pain anymore and he denies any pain in the right leg etc. Bowel function is excellent. So we spent a lot of time going over his CAT scans and explained to them that is one lesion is worrisome but that we will repeat the scans in [redacted] weeks along with an MRI of his brain and a right femur x-ray. We will then see him back after blood work as well as a Keppra level. He will continue his hydrocortisone for his adrenal insufficiency issues. I think we answered all their questions appropriately and to their satisfaction.

## 2012-04-25 ENCOUNTER — Encounter (HOSPITAL_BASED_OUTPATIENT_CLINIC_OR_DEPARTMENT_OTHER): Payer: 59

## 2012-04-25 DIAGNOSIS — C7A090 Malignant carcinoid tumor of the bronchus and lung: Secondary | ICD-10-CM

## 2012-04-25 DIAGNOSIS — C7951 Secondary malignant neoplasm of bone: Secondary | ICD-10-CM

## 2012-04-25 DIAGNOSIS — C7B8 Other secondary neuroendocrine tumors: Secondary | ICD-10-CM

## 2012-04-25 LAB — BASIC METABOLIC PANEL
CO2: 25 mEq/L (ref 19–32)
Calcium: 9.4 mg/dL (ref 8.4–10.5)
Creatinine, Ser: 1.05 mg/dL (ref 0.50–1.35)
Glucose, Bld: 123 mg/dL — ABNORMAL HIGH (ref 70–99)

## 2012-04-25 MED ORDER — DENOSUMAB 120 MG/1.7ML ~~LOC~~ SOLN
120.0000 mg | Freq: Once | SUBCUTANEOUS | Status: AC
  Administered 2012-04-25: 120 mg via SUBCUTANEOUS
  Filled 2012-04-25: qty 1.7

## 2012-04-25 NOTE — Progress Notes (Signed)
Labs drawn today for bmp 

## 2012-04-25 NOTE — Progress Notes (Signed)
[name] Conner presents today for injection per MD orders. Xgeva 120 mg administered SQ in right Abdomen. Administration without incident. Patient tolerated well.

## 2012-04-29 ENCOUNTER — Encounter (HOSPITAL_BASED_OUTPATIENT_CLINIC_OR_DEPARTMENT_OTHER): Payer: 59

## 2012-04-29 DIAGNOSIS — I2699 Other pulmonary embolism without acute cor pulmonale: Secondary | ICD-10-CM

## 2012-04-29 LAB — PROTIME-INR: Prothrombin Time: 25.7 seconds — ABNORMAL HIGH (ref 11.6–15.2)

## 2012-04-29 NOTE — Addendum Note (Signed)
Addended by: Evelena Leyden on: 04/29/2012 06:17 PM   Modules accepted: Orders

## 2012-04-29 NOTE — Progress Notes (Signed)
Labs drawn today for pt 

## 2012-05-07 ENCOUNTER — Ambulatory Visit (HOSPITAL_COMMUNITY): Payer: 59 | Admitting: Oncology

## 2012-05-09 ENCOUNTER — Other Ambulatory Visit (HOSPITAL_COMMUNITY): Payer: Self-pay | Admitting: Oncology

## 2012-05-09 ENCOUNTER — Encounter (HOSPITAL_COMMUNITY): Payer: 59 | Attending: Oncology

## 2012-05-09 DIAGNOSIS — C7951 Secondary malignant neoplasm of bone: Secondary | ICD-10-CM | POA: Insufficient documentation

## 2012-05-09 DIAGNOSIS — C801 Malignant (primary) neoplasm, unspecified: Secondary | ICD-10-CM | POA: Insufficient documentation

## 2012-05-09 DIAGNOSIS — I2699 Other pulmonary embolism without acute cor pulmonale: Secondary | ICD-10-CM | POA: Insufficient documentation

## 2012-05-09 LAB — PROTIME-INR: INR: 2.14 — ABNORMAL HIGH (ref 0.00–1.49)

## 2012-05-09 NOTE — Progress Notes (Signed)
Labs drawn today for pt 

## 2012-05-15 ENCOUNTER — Encounter (HOSPITAL_BASED_OUTPATIENT_CLINIC_OR_DEPARTMENT_OTHER): Payer: 59

## 2012-05-15 DIAGNOSIS — C787 Secondary malignant neoplasm of liver and intrahepatic bile duct: Secondary | ICD-10-CM

## 2012-05-15 DIAGNOSIS — C7A090 Malignant carcinoid tumor of the bronchus and lung: Secondary | ICD-10-CM

## 2012-05-15 DIAGNOSIS — Z452 Encounter for adjustment and management of vascular access device: Secondary | ICD-10-CM

## 2012-05-15 MED ORDER — SODIUM CHLORIDE 0.9 % IJ SOLN
10.0000 mL | INTRAMUSCULAR | Status: DC | PRN
  Administered 2012-05-15: 10 mL via INTRAVENOUS
  Filled 2012-05-15: qty 10

## 2012-05-15 MED ORDER — HEPARIN SOD (PORK) LOCK FLUSH 100 UNIT/ML IV SOLN
500.0000 [IU] | Freq: Once | INTRAVENOUS | Status: AC
  Administered 2012-05-15: 500 [IU] via INTRAVENOUS
  Filled 2012-05-15: qty 5

## 2012-05-15 MED ORDER — SODIUM CHLORIDE 0.9 % IJ SOLN
INTRAMUSCULAR | Status: AC
  Filled 2012-05-15: qty 10

## 2012-05-15 MED ORDER — HEPARIN SOD (PORK) LOCK FLUSH 100 UNIT/ML IV SOLN
INTRAVENOUS | Status: AC
  Filled 2012-05-15: qty 5

## 2012-05-15 NOTE — Progress Notes (Signed)
[name] Conner presented for Portacath access and flush.  Proper placement of portacath confirmed by CXR.  Portacath located left chest wall accessed with  H 20 needle.  Good blood return present. Portacath flushed with 20ml NS and 500U/12ml Heparin and needle removed intact.  Procedure without incident.  Patient tolerated procedure well.

## 2012-05-23 ENCOUNTER — Other Ambulatory Visit (HOSPITAL_COMMUNITY): Payer: Self-pay | Admitting: Oncology

## 2012-05-23 ENCOUNTER — Encounter (HOSPITAL_BASED_OUTPATIENT_CLINIC_OR_DEPARTMENT_OTHER): Payer: 59

## 2012-05-23 DIAGNOSIS — I2699 Other pulmonary embolism without acute cor pulmonale: Secondary | ICD-10-CM

## 2012-05-23 NOTE — Progress Notes (Signed)
Labs drawn today for pt 

## 2012-05-26 ENCOUNTER — Encounter (HOSPITAL_BASED_OUTPATIENT_CLINIC_OR_DEPARTMENT_OTHER): Payer: 59

## 2012-05-26 DIAGNOSIS — C7B8 Other secondary neuroendocrine tumors: Secondary | ICD-10-CM

## 2012-05-26 DIAGNOSIS — C7951 Secondary malignant neoplasm of bone: Secondary | ICD-10-CM

## 2012-05-26 DIAGNOSIS — C7A090 Malignant carcinoid tumor of the bronchus and lung: Secondary | ICD-10-CM

## 2012-05-26 LAB — COMPREHENSIVE METABOLIC PANEL
ALT: 20 U/L (ref 0–53)
Alkaline Phosphatase: 74 U/L (ref 39–117)
BUN: 14 mg/dL (ref 6–23)
CO2: 28 mEq/L (ref 19–32)
Chloride: 100 mEq/L (ref 96–112)
GFR calc Af Amer: 81 mL/min — ABNORMAL LOW (ref >90)
Glucose, Bld: 72 mg/dL (ref 70–99)
Potassium: 4.1 mEq/L (ref 3.5–5.1)
Sodium: 136 mEq/L (ref 135–145)
Total Bilirubin: 0.3 mg/dL (ref 0.3–1.2)
Total Protein: 7.3 g/dL (ref 6.0–8.3)

## 2012-05-26 MED ORDER — DENOSUMAB 120 MG/1.7ML ~~LOC~~ SOLN
120.0000 mg | Freq: Once | SUBCUTANEOUS | Status: AC
  Administered 2012-05-26: 120 mg via SUBCUTANEOUS
  Filled 2012-05-26: qty 1.7

## 2012-05-26 NOTE — Progress Notes (Signed)
[name] Conner presents today for injection per the provider's orders.  Xgeva administered administration without incident; see MAR for injection details.  Patient tolerated procedure well and without incident.  No questions or complaints noted at this time.  Lab Results  Component Value Date   CALCIUM 9.9 05/26/2012

## 2012-06-06 ENCOUNTER — Other Ambulatory Visit (HOSPITAL_COMMUNITY): Payer: Self-pay | Admitting: Oncology

## 2012-06-06 ENCOUNTER — Encounter (HOSPITAL_COMMUNITY): Payer: 59 | Attending: Oncology

## 2012-06-06 DIAGNOSIS — C801 Malignant (primary) neoplasm, unspecified: Secondary | ICD-10-CM | POA: Insufficient documentation

## 2012-06-06 DIAGNOSIS — I2699 Other pulmonary embolism without acute cor pulmonale: Secondary | ICD-10-CM

## 2012-06-06 DIAGNOSIS — C7A1 Malignant poorly differentiated neuroendocrine tumors: Secondary | ICD-10-CM | POA: Insufficient documentation

## 2012-06-06 DIAGNOSIS — C7952 Secondary malignant neoplasm of bone marrow: Secondary | ICD-10-CM | POA: Insufficient documentation

## 2012-06-06 DIAGNOSIS — C7951 Secondary malignant neoplasm of bone: Secondary | ICD-10-CM | POA: Insufficient documentation

## 2012-06-06 LAB — PROTIME-INR: Prothrombin Time: 22.3 seconds — ABNORMAL HIGH (ref 11.6–15.2)

## 2012-06-06 NOTE — Progress Notes (Signed)
[name] Conner presented for labwork. Labs per MD order drawn via Peripheral Line 23 gauge needle inserted in rt ac Good blood return present. Procedure without incident.  Needle removed intact. Patient tolerated procedure well.  Coumadin 4mg  daily

## 2012-06-11 ENCOUNTER — Other Ambulatory Visit (HOSPITAL_COMMUNITY): Payer: Self-pay | Admitting: Oncology

## 2012-06-16 ENCOUNTER — Encounter (HOSPITAL_COMMUNITY): Payer: 59

## 2012-06-16 ENCOUNTER — Other Ambulatory Visit (HOSPITAL_COMMUNITY): Payer: Self-pay | Admitting: Oncology

## 2012-06-16 DIAGNOSIS — I2699 Other pulmonary embolism without acute cor pulmonale: Secondary | ICD-10-CM

## 2012-06-16 DIAGNOSIS — C7A8 Other malignant neuroendocrine tumors: Secondary | ICD-10-CM

## 2012-06-16 DIAGNOSIS — C7951 Secondary malignant neoplasm of bone: Secondary | ICD-10-CM

## 2012-06-16 LAB — CBC WITH DIFFERENTIAL/PLATELET
Basophils Absolute: 0 10*3/uL (ref 0.0–0.1)
Eosinophils Absolute: 0.1 10*3/uL (ref 0.0–0.7)
Eosinophils Relative: 2 % (ref 0–5)
HCT: 41 % (ref 39.0–52.0)
Lymphocytes Relative: 32 % (ref 12–46)
Lymphs Abs: 1.3 10*3/uL (ref 0.7–4.0)
MCH: 34.5 pg — ABNORMAL HIGH (ref 26.0–34.0)
MCV: 101 fL — ABNORMAL HIGH (ref 78.0–100.0)
Monocytes Absolute: 0.3 10*3/uL (ref 0.1–1.0)
Platelets: 134 10*3/uL — ABNORMAL LOW (ref 150–400)
RDW: 13.9 % (ref 11.5–15.5)
WBC: 4.2 10*3/uL (ref 4.0–10.5)

## 2012-06-16 LAB — COMPREHENSIVE METABOLIC PANEL
CO2: 28 mEq/L (ref 19–32)
Calcium: 9.7 mg/dL (ref 8.4–10.5)
Creatinine, Ser: 1.07 mg/dL (ref 0.50–1.35)
GFR calc Af Amer: >90 mL/min (ref >90)
GFR calc non Af Amer: 79 mL/min — ABNORMAL LOW (ref >90)
Glucose, Bld: 107 mg/dL — ABNORMAL HIGH (ref 70–99)
Sodium: 137 mEq/L (ref 135–145)
Total Protein: 7.3 g/dL (ref 6.0–8.3)

## 2012-06-16 LAB — PROTIME-INR
INR: 2 — ABNORMAL HIGH (ref 0.00–1.49)
Prothrombin Time: 21.9 seconds — ABNORMAL HIGH (ref 11.6–15.2)

## 2012-06-16 NOTE — Progress Notes (Signed)
Labs drawn todayfor cbc/diff,levetiracetam,cmp,pt

## 2012-06-23 ENCOUNTER — Other Ambulatory Visit (HOSPITAL_COMMUNITY): Payer: Self-pay | Admitting: Oncology

## 2012-06-26 ENCOUNTER — Other Ambulatory Visit (HOSPITAL_COMMUNITY): Payer: Self-pay | Admitting: Oncology

## 2012-06-26 ENCOUNTER — Encounter (HOSPITAL_COMMUNITY): Payer: 59

## 2012-06-30 ENCOUNTER — Other Ambulatory Visit (HOSPITAL_COMMUNITY): Payer: Self-pay | Admitting: Oncology

## 2012-06-30 ENCOUNTER — Encounter (HOSPITAL_BASED_OUTPATIENT_CLINIC_OR_DEPARTMENT_OTHER): Payer: 59

## 2012-06-30 DIAGNOSIS — I2699 Other pulmonary embolism without acute cor pulmonale: Secondary | ICD-10-CM

## 2012-06-30 NOTE — Progress Notes (Signed)
Labs drawn today for pt 

## 2012-07-01 ENCOUNTER — Ambulatory Visit (HOSPITAL_COMMUNITY)
Admission: RE | Admit: 2012-07-01 | Discharge: 2012-07-01 | Disposition: A | Discharge status: Home / Self Care | Payer: 59 | Source: Ambulatory Visit | Attending: Oncology | Admitting: Oncology

## 2012-07-01 ENCOUNTER — Encounter (HOSPITAL_COMMUNITY): Payer: Self-pay

## 2012-07-01 DIAGNOSIS — C7951 Secondary malignant neoplasm of bone: Secondary | ICD-10-CM

## 2012-07-01 DIAGNOSIS — C7A8 Other malignant neuroendocrine tumors: Secondary | ICD-10-CM

## 2012-07-01 DIAGNOSIS — C7952 Secondary malignant neoplasm of bone marrow: Secondary | ICD-10-CM | POA: Insufficient documentation

## 2012-07-01 DIAGNOSIS — C3490 Malignant neoplasm of unspecified part of unspecified bronchus or lung: Secondary | ICD-10-CM | POA: Insufficient documentation

## 2012-07-01 DIAGNOSIS — C7931 Secondary malignant neoplasm of brain: Secondary | ICD-10-CM | POA: Insufficient documentation

## 2012-07-01 DIAGNOSIS — C772 Secondary and unspecified malignant neoplasm of intra-abdominal lymph nodes: Secondary | ICD-10-CM | POA: Insufficient documentation

## 2012-07-01 DIAGNOSIS — C787 Secondary malignant neoplasm of liver and intrahepatic bile duct: Secondary | ICD-10-CM | POA: Insufficient documentation

## 2012-07-01 MED ORDER — GADOBENATE DIMEGLUMINE 529 MG/ML IV SOLN
19.0000 mL | Freq: Once | INTRAVENOUS | Status: AC | PRN
  Administered 2012-07-01: 19 mL via INTRAVENOUS

## 2012-07-01 MED ORDER — IOHEXOL 300 MG/ML  SOLN
100.0000 mL | Freq: Once | INTRAMUSCULAR | Status: AC | PRN
  Administered 2012-07-01: 100 mL via INTRAVENOUS

## 2012-07-03 ENCOUNTER — Other Ambulatory Visit (HOSPITAL_COMMUNITY): Payer: Self-pay | Admitting: Oncology

## 2012-07-04 ENCOUNTER — Encounter (HOSPITAL_COMMUNITY): Payer: 59 | Attending: Oncology | Admitting: Oncology

## 2012-07-04 VITALS — BP 111/68 | HR 78 | Temp 97.7°F | Resp 16 | Wt 207.4 lb

## 2012-07-04 DIAGNOSIS — C7A090 Malignant carcinoid tumor of the bronchus and lung: Secondary | ICD-10-CM

## 2012-07-04 DIAGNOSIS — C801 Malignant (primary) neoplasm, unspecified: Secondary | ICD-10-CM | POA: Insufficient documentation

## 2012-07-04 DIAGNOSIS — C7B8 Other secondary neuroendocrine tumors: Secondary | ICD-10-CM

## 2012-07-04 DIAGNOSIS — C7951 Secondary malignant neoplasm of bone: Secondary | ICD-10-CM | POA: Insufficient documentation

## 2012-07-04 DIAGNOSIS — C7A1 Malignant poorly differentiated neuroendocrine tumors: Secondary | ICD-10-CM | POA: Insufficient documentation

## 2012-07-04 DIAGNOSIS — C787 Secondary malignant neoplasm of liver and intrahepatic bile duct: Secondary | ICD-10-CM

## 2012-07-04 DIAGNOSIS — C7A8 Other malignant neuroendocrine tumors: Secondary | ICD-10-CM

## 2012-07-04 DIAGNOSIS — I2699 Other pulmonary embolism without acute cor pulmonale: Secondary | ICD-10-CM | POA: Insufficient documentation

## 2012-07-04 NOTE — Patient Instructions (Addendum)
[facility]  Discharge Instructions  RECOMMENDATIONS MADE BY THE CONSULTANT AND ANY TEST RESULTS WILL BE SENT TO YOUR REFERRING DOCTOR.   EXAM FINDINGS BY MD TODAY AND SIGNS AND SYMPTOMS TO REPORT TO CLINIC OR PRIMARY MD:   Scans reviewed per Dr. Mariel Sleet INSTRUCTIONS GIVEN AND DISCUSSED: Dr. Mariel Sleet talked with Dr. Mitzi Hansen about radiation and they will be calling you  SPECIAL INSTRUCTIONS/FOLLOW-UP: Chem on Monday and xgeva  1045   I acknowledge that I have been informed and understand all the instructions given to me and received a copy. I do not have any more questions at this time, but understand that I may call the Specialty Clinic at [facility] at [phone] during business hours should I have any further questions or need assistance in obtaining follow-up care.    __________________________________________  _____________  __________ Signature of Patient or Authorized Representative            Date                   Time    __________________________________________ Nurse's Signature

## 2012-07-04 NOTE — Progress Notes (Signed)
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 He has a new lesion in the brain left side. He has multiple new lesions and growth of some old lesions in his liver which are quite impressive. He has recurrent adenopathy in the chest. He feels good except for some pain in the right upper quadrant he states. Vital signs are stable. His laboratory work the other day showed normal liver enzymes. His cancer goes back in a big way. We're going to try to switch him to weekly topotecan and get him to see Dr. Mitzi Hansen for will radiation therapy We will need to scan him with CAT scans of the chest and abdomen after 2 cycles. He and his wife both know that this therapy has a limited chance of extending his life. If this fails we need to call in hospice.

## 2012-07-04 NOTE — Progress Notes (Signed)
Refills called in per Flonnie Overman RN on Keppra.

## 2012-07-07 ENCOUNTER — Inpatient Hospital Stay (HOSPITAL_COMMUNITY): Payer: 59

## 2012-07-07 ENCOUNTER — Ambulatory Visit
Admission: RE | Admit: 2012-07-07 | Discharge: 2012-07-07 | Disposition: A | Discharge status: Home / Self Care | Payer: 59 | Source: Ambulatory Visit | Attending: Radiation Oncology | Admitting: Radiation Oncology

## 2012-07-07 ENCOUNTER — Encounter (HOSPITAL_BASED_OUTPATIENT_CLINIC_OR_DEPARTMENT_OTHER): Payer: 59

## 2012-07-07 ENCOUNTER — Other Ambulatory Visit: Payer: Self-pay | Admitting: Radiation Therapy

## 2012-07-07 ENCOUNTER — Encounter: Payer: Self-pay | Admitting: Radiation Oncology

## 2012-07-07 VITALS — BP 134/88 | HR 67 | Temp 97.5°F | Resp 16 | Wt 206.8 lb

## 2012-07-07 VITALS — BP 115/76 | HR 71 | Temp 97.6°F | Resp 18 | Ht 76.0 in | Wt 208.5 lb

## 2012-07-07 DIAGNOSIS — C7951 Secondary malignant neoplasm of bone: Secondary | ICD-10-CM

## 2012-07-07 DIAGNOSIS — C7A8 Other malignant neuroendocrine tumors: Secondary | ICD-10-CM

## 2012-07-07 DIAGNOSIS — C787 Secondary malignant neoplasm of liver and intrahepatic bile duct: Secondary | ICD-10-CM

## 2012-07-07 DIAGNOSIS — C7949 Secondary malignant neoplasm of other parts of nervous system: Secondary | ICD-10-CM

## 2012-07-07 DIAGNOSIS — Z5111 Encounter for antineoplastic chemotherapy: Secondary | ICD-10-CM

## 2012-07-07 DIAGNOSIS — C7931 Secondary malignant neoplasm of brain: Secondary | ICD-10-CM | POA: Insufficient documentation

## 2012-07-07 DIAGNOSIS — C3490 Malignant neoplasm of unspecified part of unspecified bronchus or lung: Secondary | ICD-10-CM | POA: Insufficient documentation

## 2012-07-07 DIAGNOSIS — C7A090 Malignant carcinoid tumor of the bronchus and lung: Secondary | ICD-10-CM

## 2012-07-07 DIAGNOSIS — C7B8 Other secondary neuroendocrine tumors: Secondary | ICD-10-CM

## 2012-07-07 HISTORY — DX: Malignant neoplasm of brain, unspecified: C71.9

## 2012-07-07 HISTORY — DX: Malignant neoplasm of unspecified part of unspecified bronchus or lung: C34.90

## 2012-07-07 LAB — COMPREHENSIVE METABOLIC PANEL
AST: 23 U/L (ref 0–37)
Albumin: 3.8 g/dL (ref 3.5–5.2)
Calcium: 9.3 mg/dL (ref 8.4–10.5)
Creatinine, Ser: 0.93 mg/dL (ref 0.50–1.35)
Total Protein: 7.2 g/dL (ref 6.0–8.3)

## 2012-07-07 LAB — CBC WITH DIFFERENTIAL/PLATELET
Basophils Absolute: 0 10*3/uL (ref 0.0–0.1)
Basophils Relative: 1 % (ref 0–1)
Eosinophils Absolute: 0.1 10*3/uL (ref 0.0–0.7)
Eosinophils Relative: 3 % (ref 0–5)
HCT: 38.4 % — ABNORMAL LOW (ref 39.0–52.0)
MCHC: 34.4 g/dL (ref 30.0–36.0)
Monocytes Absolute: 0.5 10*3/uL (ref 0.1–1.0)
Neutro Abs: 3.6 10*3/uL (ref 1.7–7.7)
RDW: 14.3 % (ref 11.5–15.5)

## 2012-07-07 MED ORDER — DENOSUMAB 120 MG/1.7ML ~~LOC~~ SOLN
120.0000 mg | Freq: Once | SUBCUTANEOUS | Status: AC
  Administered 2012-07-07: 120 mg via SUBCUTANEOUS
  Filled 2012-07-07: qty 1.7

## 2012-07-07 MED ORDER — HEPARIN SOD (PORK) LOCK FLUSH 100 UNIT/ML IV SOLN
INTRAVENOUS | Status: AC
  Filled 2012-07-07: qty 5

## 2012-07-07 MED ORDER — DEXAMETHASONE 4 MG PO TABS: 4.0000 mg | ORAL_TABLET | Freq: Two times a day (BID) | ORAL | Status: DC

## 2012-07-07 MED ORDER — TOPOTECAN HCL CHEMO INJECTION 4 MG
4.0000 mg/m2 | Freq: Once | INTRAVENOUS | Status: AC
  Administered 2012-07-07: 8.9 mg via INTRAVENOUS
  Filled 2012-07-07: qty 8.9

## 2012-07-07 MED ORDER — DEXAMETHASONE SODIUM PHOSPHATE 10 MG/ML IJ SOLN: 10.0000 mg | Freq: Once | INTRAMUSCULAR | Status: DC

## 2012-07-07 MED ORDER — SODIUM CHLORIDE 0.9 % IV SOLN
Freq: Once | INTRAVENOUS | Status: AC
  Administered 2012-07-07: 8 mg via INTRAVENOUS
  Filled 2012-07-07: qty 4

## 2012-07-07 MED ORDER — SODIUM CHLORIDE 0.9 % IV SOLN: 8.0000 mg | Freq: Once | INTRAVENOUS | Status: DC

## 2012-07-07 MED ORDER — HEPARIN SOD (PORK) LOCK FLUSH 100 UNIT/ML IV SOLN
500.0000 [IU] | Freq: Once | INTRAVENOUS | Status: AC | PRN
  Administered 2012-07-07: 500 [IU]
  Filled 2012-07-07: qty 5

## 2012-07-07 MED ORDER — SODIUM CHLORIDE 0.9 % IJ SOLN
10.0000 mL | INTRAMUSCULAR | Status: DC | PRN
  Administered 2012-07-07: 10 mL
  Filled 2012-07-07: qty 10

## 2012-07-07 MED ORDER — SODIUM CHLORIDE 0.9 % IV SOLN
Freq: Once | INTRAVENOUS | Status: AC
  Administered 2012-07-07: 11:00:00 via INTRAVENOUS

## 2012-07-07 NOTE — Progress Notes (Signed)
Tolerated chemo well. 

## 2012-07-07 NOTE — Progress Notes (Signed)
Complete PATIENT MEASURE OF DISTRESS worksheet with a score of 5 submitted to social work.  

## 2012-07-07 NOTE — Progress Notes (Signed)
Chemo teaching completed on topotecan. Wife and pt present. Med calendar/chemo calendar given and reviewed. Dex and Zofran called into [facility].

## 2012-07-07 NOTE — Progress Notes (Signed)
See progress note under physician encounter. 

## 2012-07-07 NOTE — Progress Notes (Signed)
Patient presented to the clinic today accompanied by his wife for a consultation with Dr. 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 Patient alert and oriented to person, place, and time. No distress noted. Steady gait noted. Pleasant affect noted. Patient denies pain at this time. However, patient reports that earlier today his right flank and headache hurt but, resolved on its own. Patient denies nausea, vomiting, or dizziness. Patient reports occasional he has floaters. Patient reports his hearing is unchanged. Patient reports sleeping without difficulty. Patient reports that he has a good appetite. Patient reports a productive cough with green sputum but, denies hemoptysis. Patient reports shortness of breath with exertion. Weight stable. Patient reports that during chemo he dropped to 170 but has picked back up to 208. Patient reports that if he stands too quickly he becomes "wobbly." Patient reports that occasionally he will be speaking to someone and stop mid sentence because he has forgotten what he was discussing. Wife reports that he will go to the kitchen and once in the kitchen forget why he went there in the first place. Patient has been married to his wife for 14 years. Four children and 14 grandchildren. Reported all findings to Dr. Mitzi Hansen.  NKDA Prior xrt to brain in [place] Denies having a pacemaker

## 2012-07-07 NOTE — Patient Instructions (Addendum)
[facility] [name] Conner  DOB 06/18/1961 CSN [id number]  MRN [id number] Dr. Glenford Peers    CHEMOTHERAPY INSTRUCTIONS  Topotecan - used to treat small cell lung ca - bone marrow suppression, diarrhea, hair loss, nausea, vomiting, headache  POTENTIAL SIDE EFFECTS OF TREATMENT: Increased Susceptibility to Infection, Vomiting, Changes in Character of Skin and Nails (brittleness, dryness,etc.), Bone Marrow Suppression, Complete Hair Loss and Nausea   SELF IMAGE NEEDS AND REFERRALS MADE: Obtain hair accessories as soon as possible (hats, etc.)   EDUCATIONAL MATERIALS GIVEN AND REVIEWED: Specific Instructions Sheets Topotecan   SELF CARE ACTIVITIES WHILE ON CHEMOTHERAPY: Increase your fluid intake 48 hours prior to treatment and drink at least 2 quarts per day after treatment., No alcohol intake., No aspirin or other medications unless approved by your oncologist., Eat foods that are light and easy to digest., Eat foods at cold or room temperature., No fried, fatty, or spicy foods immediately before or after treatment., Have teeth cleaned professionally before starting treatment. Keep dentures and partial plates clean., Use soft toothbrush and do not use mouthwashes that contain alcohol. Biotene is a good mouthwash that is available at most pharmacies or may be ordered by calling [phone]., Use warm salt water gargles (1 teaspoon salt per 1 quart warm water) before and after meals and at bedtime. Or you may rinse with 2 tablespoons of three -percent hydrogen peroxide mixed in eight ounces of water., Always use sunscreen with SPF (Sun Protection Factor) of 30 or higher., Use your nausea medication as directed to prevent nausea., Use your stool softener or laxative as directed to prevent constipation. and Use your anti-diarrheal medication as directed to stop diarrhea.  Please wash your hands for at least 30 seconds using warm soapy water. Handwashing is the  #1 way to prevent the spread of germs. Stay away from sick people or people who are getting over a cold. If you develop respiratory systems such as green/yellow mucus production or productive cough or persistent cough let us know and we will see if you need an antibiotic. It is a good idea to keep a pair of gloves on when going into grocery stores/[employer] to decrease your risk of coming into contact with germs on the carts, etc. Carry alcohol hand gel with you at all times and use it frequently if out in public. All foods need to be cooked thoroughly. No raw foods. No medium or undercooked meats, eggs. If your food is cooked medium well, it does not need to be hot pink or saturated with bloody liquid at all. Vegetables and fruits need to be washed/rinsed under the faucet with a dish detergent before being consumed. You can eat raw fruits and vegetables unless we tell you otherwise but it would be best if you cooked them or bought frozen. Do not eat off of salad bars or hot bars unless you really trust the cleanliness of the restaurant. If you need dental work, please let Dr. Mariel Sleet know before you go for your appointment so that we can coordinate the best possible time for you in regards to your chemo regimen. You need to also let your dentist know that you are actively taking chemo. We may need to do labs prior to your dental appointment. We also want your bowels moving at least every other day. If this is not happening, we need to know so that we can get you on a bowel regimen to help you go.  MEDICATIONS: You have been given prescriptions for the following medications:  Dexamethasone 4mg  tablet. Starting the day after chemo, take 2 tablets in the am and 2 tablets in the pm for 2 days.   Zofran 8mg  tablet. Starting the day after chemo, take 1 tablet in the am and 1 tablet in the pm for 2 days.   Compazine 25mg  suppository. Insert 1 rectally every 6 hours IF needed for nausea/vomiting.   EMLA  cream. Apply a quarter sized amount to port site 1 hour prior to chemo. Do not rub in. Cover with plastic.   Over-the-Counter Meds:  Colace - this is a stool softener. Take 100mg  capsule 2-6 times a day as needed. If you have to take  more than 6 capsules of Colace a day call the Cancer Center.  Senna - this is a mild laxative used to treat mild constipation. May take 2 tabs by mouth daily or up to twice a day as needed for mild constipation.  Milk of Magnesia - this is a laxative used to treat moderate to severe constipation. May take 2-4 tablespoons every 8 hours as needed. May increase to 8 tablespoons x 1 dose and if no bowel movement call the Cancer Center.  Imodium - this is for diarrhea. Take 2 tabs after 1st loose stool and then 1 tab after each loose stool until you go a total of 12 hours without a loose stool. Call Cancer Center if loose stools continue.  SYMPTOMS TO REPORT AS SOON AS POSSIBLE AFTER TREATMENT:  FEVER GREATER THAN 100.5 F  CHILLS WITH OR WITHOUT FEVER  NAUSEA AND VOMITING THAT IS NOT CONTROLLED WITH YOUR NAUSEA MEDICATION  UNUSUAL SHORTNESS OF BREATH  UNUSUAL BRUISING OR BLEEDING  TENDERNESS IN MOUTH AND THROAT WITH OR WITHOUT PRESENCE OF ULCERS  URINARY PROBLEMS  BOWEL PROBLEMS  UNUSUAL RASH    Wear comfortable clothing and clothing appropriate for easy access to any Portacath or PICC line. Let us know if there is anything that we can do to make your therapy better!      I have been informed and understand all of the instructions given to me and have received a copy. I have been instructed to call the clinic [phone] or my family physician as soon as possible for continued medical care, if indicated. I do not have any more questions at this time but understand that I may call the Cancer Center or the Patient Navigator at [phone] during office hours should I have questions or need assistance in obtaining follow-up  care.      _________________________________________      _______________     __________ Signature of Patient or Authorized Representative        Date                            Time      _________________________________________ Nurse's Signature

## 2012-07-08 ENCOUNTER — Telehealth (HOSPITAL_COMMUNITY): Payer: Self-pay

## 2012-07-08 ENCOUNTER — Telehealth: Payer: Self-pay | Admitting: *Deleted

## 2012-07-08 NOTE — Telephone Encounter (Signed)
Called patient to ask question, spoke with patient. 

## 2012-07-08 NOTE — Telephone Encounter (Signed)
CALLED PATIENT TO INFORM OF TEST, FOR 07-11-12- ARRIVAL TIME - 2:45 PM, SPOKE WITH PATIENT AND HE IS AWARE OF THIS TEST

## 2012-07-09 DIAGNOSIS — C7931 Secondary malignant neoplasm of brain: Secondary | ICD-10-CM | POA: Insufficient documentation

## 2012-07-09 NOTE — Progress Notes (Signed)
Radiation Oncology         [phone] ________________________________  Name: [name] Conner MRN: [id number]  Date: 07/07/2012  DOB: February 04, 1961  Follow-Up Visit Note  CC: Kirk Ruths, MD  Randall An, MD  Diagnosis:   Metastatic large cell neuroendocrine carcinoma  Interval Since Last Radiation:  The patient completed whole brain radiation on 08/10/2011 to a dose of 30 gray   Narrative:  The patient returns today for follow-up.    The patient developed some headaches off and on recently. He states that these, and go but really have not increased in terms of severity. The patient denies any focal weakness or any nausea or vomiting. The patient had an MRI scan of the brain on 07/01/2012. This showed a left frontal lobe lesion measuring 9 x 8 x 9 mm with some surrounding vasogenic edema which was consistent with metastatic disease. He also had a CT scan of the chest abdomen and pelvis. This did show some progression within the lungs as well as some progression of hepatic metastases/abdominal nodal metastases as well. The patient has discussed proceeding with a change in chemotherapy  through Dr. Mariel Sleet and I been asked to see him today for consideration of cranial radiation given this recent finding.                         ALLERGIES:   has no known allergies.  Meds: Current Outpatient Prescriptions  Medication Sig Dispense Refill  . acyclovir (ZOVIRAX) 400 MG tablet Take 400 mg by mouth 2 (two) times daily. #60 with prn refills       . Alum & Mag Hydroxide-Simeth (MAGIC MOUTHWASH) SOLN Take 5 mLs by mouth 4 (four) times daily as needed.        . Calcium Carbonate-Vitamin D (CALCIUM + D PO) Take 600 mg by mouth daily. 600mg  and 400 iu of vit d      . Cholecalciferol (VITAMIN D-1000 MAX ST PO) Take 1 tablet by mouth daily.      . clonazePAM (KLONOPIN) 0.5 MG tablet Take 0.5 mg by mouth 4 (four) times daily as needed. #100 1 refill (pt not taking as of 07/07/12)      . denosumab  (XGEVA) 120 MG/1.7ML SOLN Inject 120 mg into the skin every 28 (twenty-eight) days.        Marland Kitchen dexamethasone (DECADRON) 4 MG tablet Take 8 mg by mouth. Starting the day after chemo, take 2 tablets in the am and 2 tablets in the pm for 2 days.      . fluconazole (DIFLUCAN) 100 MG tablet Take 1 tablet (100 mg total) by mouth daily. Takes if he has white patches in throat and will take for 5 days.  21 tablet  3  . hydrocortisone (CORTEF) 20 MG tablet TAKE 2 TABLETS 2 TIMES A DAY ON SATURDAY AND SUNDAY, THEN TAKE 1 TABLET 2 TIMES A DAY STARTING ON MONDAY  90 tablet  5  . ibuprofen (ADVIL,MOTRIN) 200 MG tablet Take 200 mg by mouth every 6 (six) hours as needed.        . levETIRAcetam (KEPPRA) 500 MG tablet       . lidocaine-prilocaine (EMLA) cream Apply topically as needed.  30 g  2  . ondansetron (ZOFRAN) 8 MG tablet Starting the day after chemo, take 1 tablet in the am and 1 tablet in the pm for 2 days. Then may take 1 tablet two times a day IF needed  for nausea/vomiting. Side Effect: constipation      . oxyCODONE (ROXICODONE) 5 MG immediate release tablet Take 1-2 tablets, PO every 6 hours as needed for Pain  60 tablet  0  . pantoprazole (PROTONIX) 40 MG tablet take 1 tablet once daily  30 tablet  3  . prochlorperazine (COMPAZINE) 25 MG suppository Place 1 suppository (25 mg total) rectally every 6 (six) hours as needed for nausea.  12 suppository  2  . temazepam (RESTORIL) 15 MG capsule Take 1 - 2 tablets, PO, at HS  60 capsule  2  . warfarin (COUMADIN) 2 MG tablet Take 1 tablet (2 mg total) by mouth daily. To take 5 mg alternating with 4 mg.  30 tablet  3  . warfarin (COUMADIN) 4 MG tablet take 1 tablet once daily  30 tablet  2  . warfarin (COUMADIN) 5 MG tablet Take 5 mg by mouth daily. 5 mg on 8/19, 8/20, and 8/21 then back to 4 mg daily      . dexamethasone (DECADRON) 4 MG tablet Take 1 tablet (4 mg total) by mouth 2 (two) times daily with a meal.  60 tablet  0  . KLOR-CON M20 20 MEQ tablet take 1  tablet by mouth twice a day  60 each  5    Physical Findings: The patient is in no acute distress. Patient is alert and oriented.  height is 6\' 4"  (1.93 m) and weight is 208 lb 8 oz (94.575 kg). His oral temperature is 97.6 F (36.4 C). His blood pressure is 115/76 and his pulse is 71. His respiration is 18 and oxygen saturation is 98%. .   General: Well-developed, in no acute distress HEENT: Normocephalic, atraumatic; oral cavity clear Neck: Supple without any lymphadenopathy Cardiovascular: Regular rate and rhythm Respiratory: Clear to auscultation bilaterally GI: Soft, nontender, normal bowel sounds Extremities: No edema present Neuro: No focal deficits    Lab Findings: Lab Results  Component Value Date   WBC 5.7 07/07/2012   HGB 13.2 07/07/2012   HCT 38.4* 07/07/2012   MCV 98.5 07/07/2012   PLT 124* 07/07/2012     Radiographic Findings: Ct Chest W Contrast  07/01/2012  *RADIOLOGY REPORT*  Clinical Data:  Follow up lung cancer  CT CHEST, ABDOMEN AND PELVIS WITH CONTRAST  Technique:  Multidetector CT imaging of the chest, abdomen and pelvis was performed following the standard protocol during bolus administration of intravenous contrast.  Contrast: OMNIPAQUE IOHEXOL 300 MG/ML  SOLN  Comparison:  04/10/2012  CT CHEST  Findings:  Moderate to severe paraseptal and centrilobular emphysematous changes.  Stable mild nodularity in the right upper lung including a 5 mm right middle lobe nodule (series 3/image 44). No new/suspicious pulmonary nodules. No pleural effusion or pneumothorax.  The visualized thyroid is unremarkable.  The heart is normal in size.  No pericardial effusion.  Left chest port.  Progression of right hilar lymphadenopathy, including: --1.5 cm short-axis right hilar/suprahilar node (series 2/image 29), previously 8 mm --1.1 cm short-axis right hilar/infrahilar node (series 2/image 34), new --1.2 cm short-axis interlobar/right lower lobe node (series 2/image 35),  unchanged  No suspicious mediastinal or axillary lymphadenopathy.  Mild degenerative changes of the visualized thoracolumbar spine.  IMPRESSION: Progression of right hilar / infrahilar lymphadenopathy, measuring up to 1.5 cm short axis, as described above.  Stable 5 mm right middle lobe nodule.  CT ABDOMEN AND PELVIS  Findings:  Progression of numerous hepatic metastases, including: --4.5 x 4.5 cm lesion in the lateral  segment left hepatic lobe (series 2/image 54), previously 11 mm --4.2 x 3.2 cm lesion in the caudate (series 2/image 62), new  Spleen and adrenal glands are within normal limits.  2.1 x 1.9 cm lesion along the posterior aspect of the pancreatic head (series 2/image 71), new. Is a peripancreatic node, although a pancreatic metastasis is possible.  Two nonobstructing left renal calculi measuring up to 11 mm (series 2/image 78).  Associated cortical scarring and an extrarenal pelvis.  Right kidney is within normal limits.  No hydronephrosis.  No evidence of bowel obstruction.  Normal appendix.  Mild wall thickening involving the proximal sigmoid colon (series 2/image 119) without convincing pericolonic inflammatory changes.  Atherosclerotic calcifications of the abdominal aorta and branch vessels.  No abdominopelvic ascites.  Additional upper abdominal lymphadenopathy measuring up to 2.3 x 1.3 cm, new/increased.  Prostate is top normal in size.  Bladder is underdistended.  Mild degenerative changes of the lumbar spine.  IMPRESSION: Progression of hepatic metastases, measuring up to 4.5 cm, as described above.  Upper abdominal nodal metastases, including a suspected 2.1 x 1.9 cm peripancreatic node, as described above.   Original Report Authenticated By: Charline Bills, M.D.    Mr Laqueta Jean Wo Contrast  07/01/2012  *RADIOLOGY REPORT*  Clinical Data: Metastatic lung cancer.  Followup tumor resection 2012 and post chemotherapy and radiation therapy.  No complaints today.  MRI HEAD WITHOUT AND WITH  CONTRAST  Technique:  Multiplanar, multiecho pulse sequences of the brain and surrounding structures were obtained according to standard protocol without and with intravenous contrast  Contrast: 19mL MULTIHANCE GADOBENATE DIMEGLUMINE 529 MG/ML IV SOLN  Comparison: 01/30/2012, 10/29/2011 and 07/01/2011 MR.  Findings: Post right temporal craniotomy for resection of metastatic lesion which had intracranial extension.  The residual postoperative complex fluid collection at this level has decreased in size although not completely cleared.  Residual peripheral enhancement.  Decreased surrounding vasogenic edema.  Right occipital calvarial metastatic lesion without significant change.  New posterior left frontal lobe 9 x 8 x 9 mm enhancing lesion with surrounding vasogenic edema consistent with metastatic disease.  Metastatic lesion involving the C2 vertebral body was not currently imaged.  No acute infarct.  No intracranial hemorrhage separate from the postop changes right temporal region.  Major intracranial vascular structures are patent with small right vertebral artery.  Partial opacification mastoid air cells more notable on the left. Minimal to mild mucosal thickening involving portions of the frontal sinuses and ethmoid sinus air cells.  IMPRESSION: New posterior left frontal lobe 9 x 8 x 9 mm enhancing lesion with surrounding vasogenic edema consistent with metastatic disease.  Post right temporal craniotomy for resection of metastatic lesion which had intracranial extension.  The residual postoperative complex fluid collection at this level has decreased in size although not completely cleared.  Residual peripheral enhancement. Decreased surrounding vasogenic edema.  Right occipital calvarial metastatic lesion without significant change.  Metastatic lesion previously noted involving the C2 vertebral body was not currently imaged.  Partial opacification mastoid air cells more notable on the left. Minimal to mild  mucosal thickening involving portions of the frontal sinuses and ethmoid sinus air cells.  This has been made a PRA call report utilizing dashboard call feature.   Original Report Authenticated By: Fuller Canada, M.D.    Ct Abdomen Pelvis W Contrast  07/01/2012  *RADIOLOGY REPORT*  Clinical Data:  Follow up lung cancer  CT CHEST, ABDOMEN AND PELVIS WITH CONTRAST  Technique:  Multidetector CT imaging of the  chest, abdomen and pelvis was performed following the standard protocol during bolus administration of intravenous contrast.  Contrast: OMNIPAQUE IOHEXOL 300 MG/ML  SOLN  Comparison:  04/10/2012  CT CHEST  Findings:  Moderate to severe paraseptal and centrilobular emphysematous changes.  Stable mild nodularity in the right upper lung including a 5 mm right middle lobe nodule (series 3/image 44). No new/suspicious pulmonary nodules. No pleural effusion or pneumothorax.  The visualized thyroid is unremarkable.  The heart is normal in size.  No pericardial effusion.  Left chest port.  Progression of right hilar lymphadenopathy, including: --1.5 cm short-axis right hilar/suprahilar node (series 2/image 29), previously 8 mm --1.1 cm short-axis right hilar/infrahilar node (series 2/image 34), new --1.2 cm short-axis interlobar/right lower lobe node (series 2/image 35), unchanged  No suspicious mediastinal or axillary lymphadenopathy.  Mild degenerative changes of the visualized thoracolumbar spine.  IMPRESSION: Progression of right hilar / infrahilar lymphadenopathy, measuring up to 1.5 cm short axis, as described above.  Stable 5 mm right middle lobe nodule.  CT ABDOMEN AND PELVIS  Findings:  Progression of numerous hepatic metastases, including: --4.5 x 4.5 cm lesion in the lateral segment left hepatic lobe (series 2/image 54), previously 11 mm --4.2 x 3.2 cm lesion in the caudate (series 2/image 62), new  Spleen and adrenal glands are within normal limits.  2.1 x 1.9 cm lesion along the posterior aspect  of the pancreatic head (series 2/image 71), new. Is a peripancreatic node, although a pancreatic metastasis is possible.  Two nonobstructing left renal calculi measuring up to 11 mm (series 2/image 78).  Associated cortical scarring and an extrarenal pelvis.  Right kidney is within normal limits.  No hydronephrosis.  No evidence of bowel obstruction.  Normal appendix.  Mild wall thickening involving the proximal sigmoid colon (series 2/image 119) without convincing pericolonic inflammatory changes.  Atherosclerotic calcifications of the abdominal aorta and branch vessels.  No abdominopelvic ascites.  Additional upper abdominal lymphadenopathy measuring up to 2.3 x 1.3 cm, new/increased.  Prostate is top normal in size.  Bladder is underdistended.  Mild degenerative changes of the lumbar spine.  IMPRESSION: Progression of hepatic metastases, measuring up to 4.5 cm, as described above.  Upper abdominal nodal metastases, including a suspected 2.1 x 1.9 cm peripancreatic node, as described above.   Original Report Authenticated By: Charline Bills, M.D.     Impression:    The patient is a 51 year old male with metastatic large cell neuroendocrine carcinoma. He has recently received a course of whole brain radiotherapy. The patient has a new finding of a solitary metastatic lesion measuring 9 mm.  Plan:  I believe that the patient is an appropriate candidate for a course of radiosurgery. I discussed this with the patient. We will need to proceed with a SRS protocol 3T MRI scan of the brain and the patient will also be scheduled for a simulation in our Department.  We will coordinate this with the patient's upcoming course of chemotherapy. He does have some additional systemic disease and therefore I think is reasonable to proceed with this and we can incorporate the radiosurgery in to his current chemotherapy planned.  I discussed the expected benefit of such a treatment as well as the possible side effects and  risks as well, in particular radionecrosis. All the patient's questions were answered. We will therefore proceed with treatment planning.  I spent 30 minutes with the patient today, the majority of which was spent counseling the patient on the diagnosis of cancer and  coordinating care.   Jodelle Gross, M.D., Ph.D.

## 2012-07-10 ENCOUNTER — Other Ambulatory Visit (HOSPITAL_COMMUNITY): Payer: Self-pay | Admitting: Oncology

## 2012-07-10 ENCOUNTER — Encounter (HOSPITAL_COMMUNITY): Payer: 59

## 2012-07-10 DIAGNOSIS — I2699 Other pulmonary embolism without acute cor pulmonale: Secondary | ICD-10-CM

## 2012-07-10 LAB — PROTIME-INR: INR: 2.28 — ABNORMAL HIGH (ref 0.00–1.49)

## 2012-07-10 NOTE — Progress Notes (Signed)
Labs drawn today for pt 

## 2012-07-11 ENCOUNTER — Ambulatory Visit (HOSPITAL_COMMUNITY): Payer: 59

## 2012-07-11 NOTE — Addendum Note (Signed)
Encounter addended by: Delynn Flavin, RN on: 07/11/2012  2:01 PM<BR>     Documentation filed: Charges VN

## 2012-07-14 ENCOUNTER — Ambulatory Visit
Admission: RE | Admit: 2012-07-14 | Discharge: 2012-07-14 | Disposition: A | Discharge status: Home / Self Care | Payer: 59 | Source: Ambulatory Visit | Attending: Radiation Oncology | Admitting: Radiation Oncology

## 2012-07-14 ENCOUNTER — Encounter (HOSPITAL_BASED_OUTPATIENT_CLINIC_OR_DEPARTMENT_OTHER): Payer: 59

## 2012-07-14 VITALS — BP 131/80 | HR 50 | Temp 97.9°F | Resp 16 | Wt 207.6 lb

## 2012-07-14 DIAGNOSIS — C7B8 Other secondary neuroendocrine tumors: Secondary | ICD-10-CM

## 2012-07-14 DIAGNOSIS — C7949 Secondary malignant neoplasm of other parts of nervous system: Secondary | ICD-10-CM

## 2012-07-14 DIAGNOSIS — C7951 Secondary malignant neoplasm of bone: Secondary | ICD-10-CM

## 2012-07-14 DIAGNOSIS — C7A1 Malignant poorly differentiated neuroendocrine tumors: Secondary | ICD-10-CM

## 2012-07-14 DIAGNOSIS — C7A8 Other malignant neuroendocrine tumors: Secondary | ICD-10-CM

## 2012-07-14 DIAGNOSIS — C787 Secondary malignant neoplasm of liver and intrahepatic bile duct: Secondary | ICD-10-CM

## 2012-07-14 DIAGNOSIS — Z5111 Encounter for antineoplastic chemotherapy: Secondary | ICD-10-CM

## 2012-07-14 DIAGNOSIS — C7931 Secondary malignant neoplasm of brain: Secondary | ICD-10-CM

## 2012-07-14 LAB — CBC WITH DIFFERENTIAL/PLATELET
Basophils Absolute: 0 10*3/uL (ref 0.0–0.1)
HCT: 37.7 % — ABNORMAL LOW (ref 39.0–52.0)
Hemoglobin: 13.1 g/dL (ref 13.0–17.0)
Lymphocytes Relative: 13 % (ref 12–46)
Monocytes Absolute: 0.6 10*3/uL (ref 0.1–1.0)
Monocytes Relative: 6 % (ref 3–12)
Neutro Abs: 7.7 10*3/uL (ref 1.7–7.7)
Neutrophils Relative %: 81 % — ABNORMAL HIGH (ref 43–77)
WBC: 9.5 10*3/uL (ref 4.0–10.5)

## 2012-07-14 MED ORDER — SODIUM CHLORIDE 0.9 % IV SOLN
Freq: Once | INTRAVENOUS | Status: AC
  Administered 2012-07-14: 12:00:00 via INTRAVENOUS

## 2012-07-14 MED ORDER — SODIUM CHLORIDE 0.9 % IV SOLN: 8.0000 mg | Freq: Once | INTRAVENOUS | Status: DC

## 2012-07-14 MED ORDER — SODIUM CHLORIDE 0.9 % IV SOLN
Freq: Once | INTRAVENOUS | Status: AC
  Administered 2012-07-14: 8 mg via INTRAVENOUS
  Filled 2012-07-14: qty 4

## 2012-07-14 MED ORDER — DEXAMETHASONE SODIUM PHOSPHATE 10 MG/ML IJ SOLN: 10.0000 mg | Freq: Once | INTRAMUSCULAR | Status: DC

## 2012-07-14 MED ORDER — HEPARIN SOD (PORK) LOCK FLUSH 100 UNIT/ML IV SOLN
500.0000 [IU] | Freq: Once | INTRAVENOUS | Status: AC | PRN
  Administered 2012-07-14: 500 [IU]
  Filled 2012-07-14: qty 5

## 2012-07-14 MED ORDER — GADOBENATE DIMEGLUMINE 529 MG/ML IV SOLN
20.0000 mL | Freq: Once | INTRAVENOUS | Status: AC | PRN
  Administered 2012-07-14: 20 mL via INTRAVENOUS

## 2012-07-14 MED ORDER — TOPOTECAN HCL CHEMO INJECTION 4 MG
4.0000 mg/m2 | Freq: Once | INTRAVENOUS | Status: AC
  Administered 2012-07-14: 8.9 mg via INTRAVENOUS
  Filled 2012-07-14: qty 8.9

## 2012-07-14 MED ORDER — SODIUM CHLORIDE 0.9 % IJ SOLN
10.0000 mL | INTRAMUSCULAR | Status: DC | PRN
  Administered 2012-07-14: 10 mL
  Filled 2012-07-14: qty 10

## 2012-07-14 NOTE — Progress Notes (Signed)
Tolerated chemo well. 

## 2012-07-16 ENCOUNTER — Ambulatory Visit
Admission: RE | Admit: 2012-07-16 | Discharge: 2012-07-16 | Disposition: A | Discharge status: Home / Self Care | Payer: 59 | Source: Ambulatory Visit | Attending: Radiation Oncology | Admitting: Radiation Oncology

## 2012-07-16 VITALS — BP 118/68 | HR 63 | Temp 98.2°F | Resp 20 | Wt 213.8 lb

## 2012-07-16 DIAGNOSIS — Z510 Encounter for antineoplastic radiation therapy: Secondary | ICD-10-CM | POA: Insufficient documentation

## 2012-07-16 DIAGNOSIS — C801 Malignant (primary) neoplasm, unspecified: Secondary | ICD-10-CM | POA: Insufficient documentation

## 2012-07-16 DIAGNOSIS — C7931 Secondary malignant neoplasm of brain: Secondary | ICD-10-CM

## 2012-07-16 DIAGNOSIS — C7951 Secondary malignant neoplasm of bone: Secondary | ICD-10-CM

## 2012-07-16 MED ORDER — SODIUM CHLORIDE 0.9 % IJ SOLN
10.0000 mL | Freq: Once | INTRAMUSCULAR | Status: AC
  Administered 2012-07-16: 10 mL via INTRAVENOUS

## 2012-07-16 MED ORDER — HEPARIN SOD (PORK) LOCK FLUSH 100 UNIT/ML IV SOLN
500.0000 [IU] | Freq: Once | INTRAVENOUS | Status: AC
  Administered 2012-07-16: 500 [IU] via INTRAVENOUS

## 2012-07-16 NOTE — Progress Notes (Signed)
Patient completed ct sim, flushed port with 10cc normal saline and 24ml/100unitls/ml heparin per protocol, bandaid applied over site, d/c home

## 2012-07-16 NOTE — Addendum Note (Signed)
Encounter addended by: Lowella Petties, RN on: 07/16/2012  8:39 AM<BR>     Documentation filed: Notes Section, Inpatient Document Flowsheet

## 2012-07-16 NOTE — Progress Notes (Signed)
Patient arrived steady gait, gave name and dob as identification, wife at side, patient here to have his left porta cath accessed for IV contrast for Ct simulation of his brain, patient site cleaned per protocol, accessed left port x1 with 22g huber needle 1 inch, with excellent blood return,flushed port with 10cc normal saline, patient tolerated well, denys any head aches,blurred vision, nausea or pain offered cola to patient 8:33 AM  .

## 2012-07-16 NOTE — Addendum Note (Signed)
Encounter addended by: Lowella Petties, RN on: 07/16/2012  9:42 AM<BR>     Documentation filed: Notes Section, Inpatient Document Flowsheet, Inpatient [facility]

## 2012-07-16 NOTE — Progress Notes (Signed)
Asked if patient was diabetic or allergy to sulfa earlier before accessing his port, patient stated no to both,

## 2012-07-16 NOTE — Addendum Note (Signed)
Encounter addended by: Lowella Petties, RN on: 07/16/2012  9:38 AM<BR>     Documentation filed: Orders

## 2012-07-17 ENCOUNTER — Encounter (HOSPITAL_COMMUNITY): Payer: 59

## 2012-07-17 DIAGNOSIS — I2699 Other pulmonary embolism without acute cor pulmonale: Secondary | ICD-10-CM

## 2012-07-17 LAB — PROTIME-INR: INR: 5.97 (ref 0.00–1.49)

## 2012-07-17 NOTE — Progress Notes (Signed)
Labs drawn today for pt 

## 2012-07-17 NOTE — Addendum Note (Signed)
Addended byLeida Lauth on: 07/17/2012 09:24 AM   Modules accepted: Orders

## 2012-07-17 NOTE — Progress Notes (Signed)
CRITICAL VALUE ALERT Critical value received:  INR 5.97 Date of notification:  07/17/2012  Time of notification: 1021 Critical value read back:  yes Nurse who received alert:  Payton Doughty, RN MD notified (1st page):  T. Jacalyn Lefevre, PA-C  07/17/2012 1035 Per T. Jacalyn Lefevre, PA-C, patient needs to not take his coumadin 4mg  dose today and take a one time dose of Vit K 10mg  po today, which was called in to [facility] - recheck INR tomorrow.  Dewain Penning was contacted and notified of same - will come in tomorrow at [phone] for a recheck INR.  Dewain Penning denied any active bleeding other than at the venipuncture site from this am's lab draw, which the patient reports bled a little longer than normal.  No active bleeding at present.  All questions were answered at this time; patient verbalized understanding of his instruction.

## 2012-07-18 ENCOUNTER — Encounter: Payer: Self-pay | Admitting: Radiation Oncology

## 2012-07-18 ENCOUNTER — Encounter (HOSPITAL_BASED_OUTPATIENT_CLINIC_OR_DEPARTMENT_OTHER): Payer: 59

## 2012-07-18 ENCOUNTER — Ambulatory Visit
Admission: RE | Admit: 2012-07-18 | Discharge: 2012-07-18 | Disposition: A | Discharge status: Home / Self Care | Payer: 59 | Source: Ambulatory Visit | Attending: Radiation Oncology | Admitting: Radiation Oncology

## 2012-07-18 ENCOUNTER — Telehealth (HOSPITAL_COMMUNITY): Payer: Self-pay | Admitting: Oncology

## 2012-07-18 ENCOUNTER — Other Ambulatory Visit (HOSPITAL_COMMUNITY): Payer: Self-pay | Admitting: Oncology

## 2012-07-18 VITALS — BP 123/80 | HR 51 | Temp 97.3°F | Resp 18

## 2012-07-18 DIAGNOSIS — C7949 Secondary malignant neoplasm of other parts of nervous system: Secondary | ICD-10-CM

## 2012-07-18 DIAGNOSIS — I2699 Other pulmonary embolism without acute cor pulmonale: Secondary | ICD-10-CM

## 2012-07-18 LAB — PROTIME-INR
INR: 1.73 — ABNORMAL HIGH (ref 0.00–1.49)
Prothrombin Time: 19.7 seconds — ABNORMAL HIGH (ref 11.6–15.2)

## 2012-07-18 NOTE — Progress Notes (Signed)
  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 ExacTrac Snap verification was performed for each couch angle.   Narrative: The patient tolerated radiation treatment well. He had no acute complaints following treatment and he was released home after approximately one hour.  Plan: The patient has completed radiation treatment. The patient will return to radiation oncology clinic for routine followup in one month. I advised the patient to call or return sooner if they have any questions or concerns related to their recovery or treatment. ________________________________  Radene Gunning, M.D., Ph.D.

## 2012-07-18 NOTE — Progress Notes (Signed)
  Radiation Oncology         [phone] ________________________________  Name: [name] Conner MRN: [id number]  Date: 07/18/2012  DOB: [birth year]   SPECIAL TREATMENT PROCEDURE   3D TREATMENT PLANNING AND DOSIMETRY: The patient's radiation plan was reviewed and approved by Dr. Gerlene Fee from neurosurgery and radiation oncology prior to treatment. It showed 3-dimensional radiation distributions overlaid onto the planning CT/MRI image set. [facility] for the target structures as well as the organs at risk were reviewed. The documentation of the 3D plan and dosimetry are filed in the radiation oncology EMR.   NARRATIVE: The patient was brought to the TrueBeam stereotactic radiation treatment machine and placed supine on the CT couch. The head frame was applied, and the patient was set up for stereotactic radiosurgery. Neurosurgery was present for the set-up and delivery   SIMULATION VERIFICATION: In the couch zero-angle position, the patient underwent Exactrac imaging using the Brainlab system with orthogonal KV images. These were carefully aligned and repeated to confirm treatment position for each of the isocenters. The Exactrac snap film verification was repeated at each couch angle.   SPECIAL TREATMENT PROCEDURE: The patient received stereotactic radiosurgery to the following targets:  Left parietal 9mm target was treated using 3 Arcs to a prescription dose of 20 Gy. ExacTrac Snap verification was performed for each couch angle.   STEREOTACTIC TREATMENT MANAGEMENT: Following delivery, the patient was transported to nursing in stable condition and monitored for possible acute effects. Vital signs were recorded . The patient tolerated treatment without significant acute effects, and was discharged to home in stable condition.  PLAN: Follow-up in one month.   ------------------------------------------------  Radene Gunning, MD, PhD

## 2012-07-18 NOTE — Progress Notes (Signed)
Received patient and his wife in the clinic today for one hour nurse monitoring following initial SRS treatment. Patient alert and oriented to person, place, and time. No distress noted. Steady gait noted. Pleasant affect noted. Patient denies pain at this time. Patient denies nausea, vomiting, headache, dizziness, or diplopia. Vitals stable. Provided patient and his wife with drink and graham crackers. Patient reports taking Decadron 4 mg bid. Patient given follow up appointment card and directions by Irwin Brakeman, RT.

## 2012-07-18 NOTE — Telephone Encounter (Signed)
[employer] [phone] PER ELAINE A AUTH FOR [id number] HYCAMTIN DOES NOT REQUIRE AUTH.

## 2012-07-18 NOTE — Progress Notes (Signed)
Labs drawn today for pt 

## 2012-07-18 NOTE — Progress Notes (Signed)
  Radiation Oncology         [phone] ________________________________  Name: [name] Conner MRN: [id number]  Date: 07/16/2012  DOB: 1961/09/02  SIMULATION AND TREATMENT PLANNING NOTE  DIAGNOSIS:  Metastatic cancer with brain metastasis  NARRATIVE:  The patient was brought to the CT Simulation planning suite.  Identity was confirmed.  All relevant records and images related to the planned course of therapy were reviewed.  The patient freely provided informed written consent to proceed with treatment after reviewing the details related to the planned course of therapy. The consent form was witnessed and verified by the simulation staff. Intravenous access was established for contrast administration. Then, the patient was set-up in a stable reproducible supine position for radiation therapy.  A relocatable thermoplastic stereotactic head frame was fabricated for precise immobilization.  CT images were obtained.  Surface markings were placed.  The CT images were loaded into the planning software and fused with the patient's targeting MRI scan.  Then the target and avoidance structures were contoured.  Treatment planning then occurred.  The radiation prescription was entered and confirmed.  I have requested 3D planning  I have requested a DVH of the following structures: Brain stem, brain, left eye, right I, lenses, optic chiasm, target volumes, uninvolved brain, and normal tissue.    PLAN:  The patient will receive 20 Gy in 1 fraction.  ________________________________  Radene Gunning, MD, PhD

## 2012-07-18 NOTE — Progress Notes (Signed)
One hour nurse monitoring complete. Patient alert and oriented to person, place, and time. No distress noted. Patient sitting up in chair. Pleasant affect noted. Patient denies pain at this time. Patient denies headache, dizziness, nausea, vomiting, or diplopia. Patient has no complaints at this time. Patient states, "I feel fine." Patient reports taking Decadron 4 mg bid. Reported all findings to Dr. Mitzi Conner.

## 2012-07-21 ENCOUNTER — Other Ambulatory Visit (HOSPITAL_COMMUNITY): Payer: Self-pay | Admitting: Oncology

## 2012-07-21 ENCOUNTER — Encounter (HOSPITAL_BASED_OUTPATIENT_CLINIC_OR_DEPARTMENT_OTHER): Payer: 59

## 2012-07-21 VITALS — BP 134/76 | HR 72 | Temp 98.4°F | Resp 16 | Wt 212.8 lb

## 2012-07-21 DIAGNOSIS — C7B8 Other secondary neuroendocrine tumors: Secondary | ICD-10-CM

## 2012-07-21 DIAGNOSIS — C7A8 Other malignant neuroendocrine tumors: Secondary | ICD-10-CM

## 2012-07-21 DIAGNOSIS — Z452 Encounter for adjustment and management of vascular access device: Secondary | ICD-10-CM

## 2012-07-21 DIAGNOSIS — C7951 Secondary malignant neoplasm of bone: Secondary | ICD-10-CM

## 2012-07-21 DIAGNOSIS — C7A090 Malignant carcinoid tumor of the bronchus and lung: Secondary | ICD-10-CM

## 2012-07-21 LAB — CBC WITH DIFFERENTIAL/PLATELET
Basophils Relative: 1 % (ref 0–1)
Eosinophils Absolute: 0 10*3/uL (ref 0.0–0.7)
Hemoglobin: 12.4 g/dL — ABNORMAL LOW (ref 13.0–17.0)
Lymphocytes Relative: 28 % (ref 12–46)
MCHC: 34.5 g/dL (ref 30.0–36.0)
Neutrophils Relative %: 68 % (ref 43–77)
Platelets: 33 10*3/uL — ABNORMAL LOW (ref 150–400)
RBC: 3.72 MIL/uL — ABNORMAL LOW (ref 4.22–5.81)

## 2012-07-21 LAB — COMPREHENSIVE METABOLIC PANEL
ALT: 126 U/L — ABNORMAL HIGH (ref 0–53)
AST: 47 U/L — ABNORMAL HIGH (ref 0–37)
Albumin: 3.3 g/dL — ABNORMAL LOW (ref 3.5–5.2)
Alkaline Phosphatase: 72 U/L (ref 39–117)
BUN: 30 mg/dL — ABNORMAL HIGH (ref 6–23)
Chloride: 95 mEq/L — ABNORMAL LOW (ref 96–112)
Potassium: 3.8 mEq/L (ref 3.5–5.1)
Sodium: 133 mEq/L — ABNORMAL LOW (ref 135–145)
Total Bilirubin: 0.4 mg/dL (ref 0.3–1.2)
Total Protein: 6.4 g/dL (ref 6.0–8.3)

## 2012-07-21 MED ORDER — SODIUM CHLORIDE 0.9 % IJ SOLN
20.0000 mL | INTRAMUSCULAR | Status: DC | PRN
  Administered 2012-07-21: 20 mL via INTRAVENOUS
  Filled 2012-07-21: qty 20

## 2012-07-21 MED ORDER — SODIUM CHLORIDE 0.9 % IV SOLN
INTRAVENOUS | Status: DC
  Administered 2012-07-21: 11:00:00 via INTRAVENOUS

## 2012-07-21 MED ORDER — HEPARIN SOD (PORK) LOCK FLUSH 100 UNIT/ML IV SOLN
INTRAVENOUS | Status: AC
  Filled 2012-07-21: qty 5

## 2012-07-21 MED ORDER — HEPARIN SOD (PORK) LOCK FLUSH 100 UNIT/ML IV SOLN
500.0000 [IU] | Freq: Once | INTRAVENOUS | Status: AC
  Administered 2012-07-21: 500 [IU] via INTRAVENOUS
  Filled 2012-07-21: qty 5

## 2012-07-21 MED ORDER — SODIUM CHLORIDE 0.9 % IJ SOLN
INTRAMUSCULAR | Status: AC
  Filled 2012-07-21: qty 20

## 2012-07-23 ENCOUNTER — Encounter (HOSPITAL_BASED_OUTPATIENT_CLINIC_OR_DEPARTMENT_OTHER): Payer: 59 | Admitting: Oncology

## 2012-07-23 ENCOUNTER — Encounter (HOSPITAL_COMMUNITY): Payer: 59

## 2012-07-23 ENCOUNTER — Other Ambulatory Visit (HOSPITAL_COMMUNITY): Payer: Self-pay | Admitting: Oncology

## 2012-07-23 DIAGNOSIS — B370 Candidal stomatitis: Secondary | ICD-10-CM

## 2012-07-23 DIAGNOSIS — I2699 Other pulmonary embolism without acute cor pulmonale: Secondary | ICD-10-CM

## 2012-07-23 DIAGNOSIS — C7A8 Other malignant neuroendocrine tumors: Secondary | ICD-10-CM

## 2012-07-23 DIAGNOSIS — C7951 Secondary malignant neoplasm of bone: Secondary | ICD-10-CM

## 2012-07-23 LAB — CBC WITH DIFFERENTIAL/PLATELET
Basophils Absolute: 0 10*3/uL (ref 0.0–0.1)
Basophils Relative: 1 % (ref 0–1)
Eosinophils Absolute: 0 10*3/uL (ref 0.0–0.7)
MCH: 34.1 pg — ABNORMAL HIGH (ref 26.0–34.0)
MCHC: 34.6 g/dL (ref 30.0–36.0)
Monocytes Relative: 8 % (ref 3–12)
Neutro Abs: 1.3 10*3/uL — ABNORMAL LOW (ref 1.7–7.7)
Neutrophils Relative %: 60 % (ref 43–77)
Platelets: 22 10*3/uL — CL (ref 150–400)
RDW: 15.3 % (ref 11.5–15.5)

## 2012-07-23 LAB — PROTIME-INR: INR: 1.5 — ABNORMAL HIGH (ref 0.00–1.49)

## 2012-07-23 MED ORDER — FIRST-DUKES MOUTHWASH MT SUSP: 5.0000 mL | Freq: Four times a day (QID) | OROMUCOSAL | Status: AC | PRN

## 2012-07-23 MED ORDER — FLUCONAZOLE 100 MG PO TABS: 200.0000 mg | ORAL_TABLET | Freq: Every day | ORAL | Status: DC

## 2012-07-23 NOTE — Progress Notes (Signed)
CRITICAL VALUE ALERT Critical value received:  Plt 22,000 Date of notification:  07/23/2012  Time of notification: 1035 Critical value read back:  yes Nurse who received alert:  Payton Doughty, RN MD notified (1st page):  T. Jacalyn Lefevre, PA-C  07/23/2012 1037  Per T. Jacalyn Lefevre, PA-C, patient needs to be informed of bleeding precautions; no further follow-up needed at present.  Attempted to contact patient, message left on voice mail for him to call our office.

## 2012-07-23 NOTE — Progress Notes (Signed)
Labs drawn today for pt,cbc/diff 

## 2012-07-23 NOTE — Progress Notes (Signed)
[name] Conner is seen as a walk in today  He is having a sore mouth from oral candidiasis.  He has been swishing and swallowing Duke's Mouthwash and diflucan 100 mg daily x 7 days without resolution or improvement.    We will get a CBC today to make sure he has an adequate WBC count.  I discussed the case with Dr. Mariel Sleet, pharmacist, and Gerrit Halls (GI).  We decided to increase the Diflucan dose to 200 mg daily, continue with Dukes Mouthwash QID.  We will see how he is doing Monday and if he is not better, we will switch to Vfend or Noxafil.  Since we are increasing his Diflucan dose, we will need to keep close tabs on his INR.  We will perform an INR on Friday, despite his results from his INR today.   KEFALAS,THOMAS

## 2012-07-25 ENCOUNTER — Encounter (HOSPITAL_BASED_OUTPATIENT_CLINIC_OR_DEPARTMENT_OTHER): Payer: 59

## 2012-07-25 ENCOUNTER — Other Ambulatory Visit (HOSPITAL_COMMUNITY): Payer: Self-pay | Admitting: Oncology

## 2012-07-25 DIAGNOSIS — I2699 Other pulmonary embolism without acute cor pulmonale: Secondary | ICD-10-CM

## 2012-07-25 LAB — PROTIME-INR: Prothrombin Time: 19.1 seconds — ABNORMAL HIGH (ref 11.6–15.2)

## 2012-07-25 NOTE — Progress Notes (Signed)
Labs drawn today for pt 

## 2012-07-28 ENCOUNTER — Encounter (HOSPITAL_COMMUNITY): Payer: 59

## 2012-07-28 ENCOUNTER — Encounter (HOSPITAL_BASED_OUTPATIENT_CLINIC_OR_DEPARTMENT_OTHER): Payer: 59

## 2012-07-28 VITALS — BP 147/88 | HR 70 | Temp 97.8°F | Resp 16 | Wt 212.6 lb

## 2012-07-28 DIAGNOSIS — C7A8 Other malignant neuroendocrine tumors: Secondary | ICD-10-CM

## 2012-07-28 DIAGNOSIS — C7951 Secondary malignant neoplasm of bone: Secondary | ICD-10-CM

## 2012-07-28 DIAGNOSIS — I2699 Other pulmonary embolism without acute cor pulmonale: Secondary | ICD-10-CM

## 2012-07-28 LAB — COMPREHENSIVE METABOLIC PANEL
ALT: 150 U/L — ABNORMAL HIGH (ref 0–53)
AST: 67 U/L — ABNORMAL HIGH (ref 0–37)
Albumin: 3.2 g/dL — ABNORMAL LOW (ref 3.5–5.2)
CO2: 23 mEq/L (ref 19–32)
Calcium: 8.7 mg/dL (ref 8.4–10.5)
GFR calc non Af Amer: >90 mL/min (ref >90)
Sodium: 132 mEq/L — ABNORMAL LOW (ref 135–145)
Total Protein: 6.4 g/dL (ref 6.0–8.3)

## 2012-07-28 LAB — CBC WITH DIFFERENTIAL/PLATELET
Basophils Absolute: 0 10*3/uL (ref 0.0–0.1)
Basophils Relative: 2 % — ABNORMAL HIGH (ref 0–1)
Eosinophils Relative: 2 % (ref 0–5)
Lymphocytes Relative: 34 % (ref 12–46)
MCHC: 34 g/dL (ref 30.0–36.0)
MCV: 98.9 fL (ref 78.0–100.0)
Platelets: 26 10*3/uL — CL (ref 150–400)
RDW: 16.9 % — ABNORMAL HIGH (ref 11.5–15.5)
WBC: 1.9 10*3/uL — ABNORMAL LOW (ref 4.0–10.5)

## 2012-07-28 LAB — PROTIME-INR: INR: 2.86 — ABNORMAL HIGH (ref 0.00–1.49)

## 2012-07-28 NOTE — Progress Notes (Signed)
CRITICAL VALUE ALERT Critical value received:  Plt 26K, WBC 1.9, Hgb 12.2 Date of notification:  07/28/2012  Time of notification: 0925 Critical value read back:  yes Nurse who received alert:  Payton Doughty, RN MD notified (1st page):  Dr. Mariel Sleet   07/28/2012 0930 Patient held 1 week per Dr. Mariel Sleet; no further follow-up given at this time.

## 2012-07-28 NOTE — Progress Notes (Signed)
Labs drawn today for cbc/diff,cmp,pt 

## 2012-07-28 NOTE — Progress Notes (Signed)
Pt chemo held today due to low blood counts.  R/S for one week from today by Tomasita Morrow, RN.Chemo frequency changed by Dr. Mariel Sleet.

## 2012-07-30 ENCOUNTER — Other Ambulatory Visit (HOSPITAL_COMMUNITY): Payer: Self-pay | Admitting: Oncology

## 2012-07-30 ENCOUNTER — Telehealth (HOSPITAL_COMMUNITY): Payer: Self-pay

## 2012-07-30 ENCOUNTER — Encounter (HOSPITAL_COMMUNITY): Payer: 59

## 2012-07-30 DIAGNOSIS — I2699 Other pulmonary embolism without acute cor pulmonale: Secondary | ICD-10-CM

## 2012-07-30 DIAGNOSIS — C7A8 Other malignant neuroendocrine tumors: Secondary | ICD-10-CM

## 2012-07-30 DIAGNOSIS — C7951 Secondary malignant neoplasm of bone: Secondary | ICD-10-CM

## 2012-07-30 LAB — PROTIME-INR: INR: 2.44 — ABNORMAL HIGH (ref 0.00–1.49)

## 2012-07-30 NOTE — Progress Notes (Signed)
Labs drawn today for pt 

## 2012-07-30 NOTE — Telephone Encounter (Signed)
Message left at home number for patient to continue same dose of Coumadin and to have his blood checked Monday when he comes for his doctor appointment.  Patient instructed to call back with any questions.

## 2012-08-01 ENCOUNTER — Other Ambulatory Visit (HOSPITAL_COMMUNITY): Payer: Self-pay | Admitting: Oncology

## 2012-08-03 ENCOUNTER — Other Ambulatory Visit: Payer: Self-pay

## 2012-08-03 ENCOUNTER — Observation Stay (HOSPITAL_COMMUNITY)
Admission: EM | Admit: 2012-08-03 | Discharge: 2012-08-04 | DRG: 193 | Disposition: A | Discharge status: Hospice | Payer: 59 | Source: Ambulatory Visit | Attending: Internal Medicine | Admitting: Internal Medicine

## 2012-08-03 ENCOUNTER — Emergency Department (HOSPITAL_COMMUNITY): Payer: 59

## 2012-08-03 ENCOUNTER — Encounter (HOSPITAL_COMMUNITY): Payer: Self-pay | Admitting: *Deleted

## 2012-08-03 DIAGNOSIS — D6959 Other secondary thrombocytopenia: Secondary | ICD-10-CM | POA: Diagnosis present

## 2012-08-03 DIAGNOSIS — D696 Thrombocytopenia, unspecified: Secondary | ICD-10-CM | POA: Diagnosis present

## 2012-08-03 DIAGNOSIS — J189 Pneumonia, unspecified organism: Principal | ICD-10-CM | POA: Diagnosis present

## 2012-08-03 DIAGNOSIS — C7951 Secondary malignant neoplasm of bone: Secondary | ICD-10-CM | POA: Diagnosis present

## 2012-08-03 DIAGNOSIS — C787 Secondary malignant neoplasm of liver and intrahepatic bile duct: Secondary | ICD-10-CM | POA: Diagnosis present

## 2012-08-03 DIAGNOSIS — R1011 Right upper quadrant pain: Secondary | ICD-10-CM | POA: Diagnosis present

## 2012-08-03 DIAGNOSIS — Z833 Family history of diabetes mellitus: Secondary | ICD-10-CM

## 2012-08-03 DIAGNOSIS — Z87891 Personal history of nicotine dependence: Secondary | ICD-10-CM

## 2012-08-03 DIAGNOSIS — Z8042 Family history of malignant neoplasm of prostate: Secondary | ICD-10-CM

## 2012-08-03 DIAGNOSIS — E871 Hypo-osmolality and hyponatremia: Secondary | ICD-10-CM | POA: Diagnosis present

## 2012-08-03 DIAGNOSIS — R319 Hematuria, unspecified: Secondary | ICD-10-CM

## 2012-08-03 DIAGNOSIS — R0902 Hypoxemia: Secondary | ICD-10-CM

## 2012-08-03 DIAGNOSIS — Z515 Encounter for palliative care: Secondary | ICD-10-CM

## 2012-08-03 DIAGNOSIS — Z9221 Personal history of antineoplastic chemotherapy: Secondary | ICD-10-CM

## 2012-08-03 DIAGNOSIS — I2699 Other pulmonary embolism without acute cor pulmonale: Secondary | ICD-10-CM | POA: Diagnosis present

## 2012-08-03 DIAGNOSIS — Z66 Do not resuscitate: Secondary | ICD-10-CM | POA: Diagnosis present

## 2012-08-03 DIAGNOSIS — I2510 Atherosclerotic heart disease of native coronary artery without angina pectoris: Secondary | ICD-10-CM | POA: Diagnosis present

## 2012-08-03 DIAGNOSIS — C7949 Secondary malignant neoplasm of other parts of nervous system: Secondary | ICD-10-CM

## 2012-08-03 DIAGNOSIS — Z79899 Other long term (current) drug therapy: Secondary | ICD-10-CM

## 2012-08-03 DIAGNOSIS — C799 Secondary malignant neoplasm of unspecified site: Secondary | ICD-10-CM

## 2012-08-03 DIAGNOSIS — C801 Malignant (primary) neoplasm, unspecified: Secondary | ICD-10-CM

## 2012-08-03 DIAGNOSIS — C7A8 Other malignant neuroendocrine tumors: Secondary | ICD-10-CM

## 2012-08-03 DIAGNOSIS — Z6825 Body mass index (BMI) 25.0-25.9, adult: Secondary | ICD-10-CM

## 2012-08-03 DIAGNOSIS — J9600 Acute respiratory failure, unspecified whether with hypoxia or hypercapnia: Secondary | ICD-10-CM | POA: Diagnosis present

## 2012-08-03 DIAGNOSIS — B370 Candidal stomatitis: Secondary | ICD-10-CM

## 2012-08-03 DIAGNOSIS — Z7901 Long term (current) use of anticoagulants: Secondary | ICD-10-CM

## 2012-08-03 DIAGNOSIS — Z923 Personal history of irradiation: Secondary | ICD-10-CM

## 2012-08-03 DIAGNOSIS — Z8249 Family history of ischemic heart disease and other diseases of the circulatory system: Secondary | ICD-10-CM

## 2012-08-03 DIAGNOSIS — E669 Obesity, unspecified: Secondary | ICD-10-CM | POA: Diagnosis present

## 2012-08-03 DIAGNOSIS — Z801 Family history of malignant neoplasm of trachea, bronchus and lung: Secondary | ICD-10-CM

## 2012-08-03 DIAGNOSIS — C3490 Malignant neoplasm of unspecified part of unspecified bronchus or lung: Secondary | ICD-10-CM

## 2012-08-03 DIAGNOSIS — C7931 Secondary malignant neoplasm of brain: Secondary | ICD-10-CM

## 2012-08-03 DIAGNOSIS — Z86711 Personal history of pulmonary embolism: Secondary | ICD-10-CM

## 2012-08-03 DIAGNOSIS — IMO0001 Reserved for inherently not codable concepts without codable children: Secondary | ICD-10-CM | POA: Diagnosis present

## 2012-08-03 DIAGNOSIS — Z87442 Personal history of urinary calculi: Secondary | ICD-10-CM

## 2012-08-03 DIAGNOSIS — T451X5A Adverse effect of antineoplastic and immunosuppressive drugs, initial encounter: Secondary | ICD-10-CM | POA: Diagnosis present

## 2012-08-03 MED ORDER — ONDANSETRON HCL 4 MG/2ML IJ SOLN
4.0000 mg | Freq: Once | INTRAMUSCULAR | Status: AC
  Administered 2012-08-04: 4 mg via INTRAVENOUS
  Filled 2012-08-03: qty 2

## 2012-08-03 MED ORDER — SODIUM CHLORIDE 0.9 % IV SOLN
INTRAVENOUS | Status: DC
  Administered 2012-08-04: 500 mL via INTRAVENOUS

## 2012-08-03 MED ORDER — HYDROMORPHONE HCL PF 1 MG/ML IJ SOLN
1.0000 mg | Freq: Once | INTRAMUSCULAR | Status: AC
  Administered 2012-08-04: 1 mg via INTRAVENOUS
  Filled 2012-08-03: qty 1

## 2012-08-03 MED ORDER — SODIUM CHLORIDE 0.9 % IV BOLUS (SEPSIS)
500.0000 mL | Freq: Once | INTRAVENOUS | Status: AC
  Administered 2012-08-04: 500 mL via INTRAVENOUS

## 2012-08-03 NOTE — ED Notes (Signed)
Pt c/o rlq pain. Pt states he has been coughing a lot recently. Pt noticed pain this afternoon. Pt is seen here at the cancer center.

## 2012-08-03 NOTE — ED Provider Notes (Signed)
History   This chart was scribed for Flint Melter, MD by Leone Payor, ED Scribe. This patient was seen in room [facility] and the patient's care was started at 2325.   CSN: [id number]  Arrival date & time 08/03/12  2205   First MD Initiated Contact with Patient 08/03/12 2325      Chief Complaint  Patient presents with  . Abdominal Pain      KEEVAN WOLZ is a 51 y.o. male who presents to the Emergency Department complaining of sharp pain in the RUQ starting today . Pt states he has been coughing a lot recently and noticed the pain this afternoon. He is positive for a light green phlegm with the cough and is negative for fever, nausea, vomiting, weakness, dizziness. Pt takes 5 mg oxycodone 1-2x a day, last taken about two hours ago.  Pt takes 4mg  coumadin daily. Pt is a former smoker but denies alcohol use. He is under active treatment for metastatic cancer. He recently had thrush, but that has improved. 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 His oncologist has discussed end-of-life issues with him. He is taking his Oxycodone without relief of his pain. There are no aggravating factors.  The history is provided by the patient. No language interpreter was used.    Past Medical History  Diagnosis Date  . Renal disorder     kidney stones  . PE (pulmonary embolism)   . Heart disease     [place] [facility]   . Normal cardiac stress test     "14 years ago," reported on 12/27/11  . Bone metastases 07/19/2011  . 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 Brain cancer   . Lung cancer     small cell lung    Past Surgical History  Procedure Date  . Kidney surgery 2010    rattach ureter after kidney stones  . Cardiac catheterization 2011  . Brain surgery nov 2012    tumor removed  . Lung biopsy   . Craniotomy for tumor 07/2011  . Porta-a-cath insertion 07/23/11   . Powerport   . Portacath placement 07/23/2011    Procedure: INSERTION PORT-A-CATH;  Surgeon: Dalia Heading;  Location: [facility];  Service: General;  Laterality: Left;  Power Port (Left chest) attached to 64F catheter in Left Subclavian    Family History  Problem Relation Age of Onset  . Diabetes Mother   . Hypertension Mother   . Hypotension Mother   . Heart attack Father   . Prostate cancer Maternal Grandfather   . Cancer Maternal Grandfather     prostate  . Lung cancer Paternal Grandfather   . Cancer Paternal Grandfather     skin, bone, lung    History  Substance Use Topics  . Smoking status: Former Smoker -- 2.0 packs/day for 34 years    Types: Cigarettes  . Smokeless tobacco: Former Neurosurgeon    Quit date: 06/25/2011  . Alcohol Use: No      Review of Systems  Constitutional: Negative for fever.  Respiratory: Positive for cough (productive with light green phlegm).   Gastrointestinal: Negative for nausea and vomiting.  Neurological: Negative for dizziness and weakness.  All other systems reviewed and are negative.    Allergies  Review of patient's allergies indicates no known allergies.  Home Medications   Current Outpatient Rx  Name  Route  Sig  Dispense  Refill  . ACYCLOVIR 400 MG PO TABS  Oral   Take 400 mg by mouth 2 (two) times daily. #60 with prn refills          . CALCIUM + D PO   Oral   Take 600 mg by mouth daily. 600mg  and 400 iu of vit d         . VITAMIN D-1000 MAX ST PO   Oral   Take 1 tablet by mouth daily.         Marland Kitchen CLONAZEPAM 0.5 MG PO TABS   Oral   Take 0.5 mg by mouth 4 (four) times daily as needed. #100 1 refill (pt not taking as of 07/07/12)         . DENOSUMAB 120 MG/1.7ML  SOLN   Subcutaneous   Inject 120 mg into the skin every 28 (twenty-eight) days.           Marland Kitchen DEXAMETHASONE 4 MG PO TABS   Oral   Take 8 mg by mouth. Starting the day after chemo, take 2 tablets in the am and 2 tablets in the pm for 2 days.         Marland Kitchen DEXAMETHASONE 4 MG PO TABS   Oral   Take 1 tablet (4 mg total) by mouth 2 (two) times daily with a  meal.   60 tablet   0   . FIRST-DUKES MOUTHWASH MT SUSP   Mouth/Throat   Use as directed 5 mLs in the mouth or throat 4 (four) times daily as needed.   400 mL   1   . FLUCONAZOLE 100 MG PO TABS   Oral   Take 2 tablets (200 mg total) by mouth daily. Takes if he has white patches in throat and will take for 5 days.   30 tablet   2   . HYDROCORTISONE 20 MG PO TABS      TAKE 2 TABLETS 2 TIMES A DAY ON SATURDAY AND SUNDAY, THEN TAKE 1 TABLET 2 TIMES A DAY STARTING ON MONDAY   90 tablet   5   . IBUPROFEN 200 MG PO TABS   Oral   Take 200 mg by mouth every 6 (six) hours as needed.           Marland Kitchen KLOR-CON M20 20 MEQ PO TBCR      take 1 tablet by mouth twice a day   60 each   5   . LEVETIRACETAM 500 MG PO TABS               . LIDOCAINE-PRILOCAINE 2.5-2.5 % EX CREA   Topical   Apply topically as needed.   30 g   2   . OXYCODONE HCL 5 MG PO TABS      Take 1-2 tablets, PO every 6 hours as needed for Pain   60 tablet   0   . PANTOPRAZOLE SODIUM 40 MG PO TBEC      take 1 tablet once daily   30 tablet   3   . PROCHLORPERAZINE 25 MG RE SUPP   Rectal   Place 1 suppository (25 mg total) rectally every 6 (six) hours as needed for nausea.   12 suppository   2   . TEMAZEPAM 15 MG PO CAPS      Take 1 - 2 tablets, PO, at HS   60 capsule   2   . WARFARIN SODIUM 2 MG PO TABS   Oral   Take 1 tablet (2 mg total) by mouth daily. To take  5 mg alternating with 4 mg.   30 tablet   3   . WARFARIN SODIUM 4 MG PO TABS      take 1 tablet once daily   30 tablet   2   . WARFARIN SODIUM 5 MG PO TABS   Oral   Take 5 mg by mouth daily. 5 mg on 8/19, 8/20, and 8/21 then back to 4 mg daily           BP 120/79  Pulse 100  Temp 98.2 F (36.8 C) (Oral)  Resp 16  SpO2 89% initial pulse improved, at exam.Physical Exam  Nursing note and vitals reviewed. Constitutional: He is oriented to person, place, and time. He appears well-developed. No distress.        Ill-appearing.   HENT:  Head: Normocephalic and atraumatic.  Right Ear: External ear normal.  Left Ear: External ear normal.  Eyes: Conjunctivae normal and EOM are normal. Pupils are equal, round, and reactive to light.  Neck: Normal range of motion. Neck supple. No tracheal deviation present.  Cardiovascular: Normal rate.        Irregular, rate is 95 Weak pulse  Pulmonary/Chest: Effort normal and breath sounds normal. No respiratory distress. He has no wheezes. He has no rales.  Abdominal: Soft. Bowel sounds are normal. He exhibits no mass. There is tenderness (RUQ). There is no rebound and no guarding.       No hepatomegaly.   Musculoskeletal: Normal range of motion.  Neurological: He is alert and oriented to person, place, and time. No cranial nerve deficit. He exhibits normal muscle tone. Coordination normal.  Skin: Skin is warm and dry.  Psychiatric: He has a normal mood and affect. His behavior is normal.    ED Course  Procedures (including critical care time)  ED Treatment; IV NS, bolus and drip. IV Levaquin  Pt desaturated to 88% on RA; treated with [place] O2   03:00- patient is comfortable, despite having oxygen saturations 89-90% on 2 L nasal cannula. Heart rate, somewhat labile, most recently, 103, now. He is not on O2 at home.   DIAGNOSTIC STUDIES: Oxygen Saturation is 91% on room air, low by my interpretation.    COORDINATION OF CARE:    Date: 06/20/2012  Rate: 103  Rhythm: sinus tachycardia  QRS Axis: normal  PR and QT Intervals: normal  ST/T Wave abnormalities: normal  PR and QRS Conduction Disutrbances:none  Narrative Interpretation: possible LA enlargement  Old EKG Reviewed: changes noted- rate faster      11:34 PM Discussed treatment plan which includes pain medications and labs with pt at bedside and pt agreed to plan.     Labs Reviewed  CBC WITH DIFFERENTIAL - Abnormal; Notable for the following:    RBC 3.93 (*)     HCT 38.7 (*)     RDW 17.7  (*)     Platelets 55 (*)     Neutrophils Relative 79 (*)     All other components within normal limits  BASIC METABOLIC PANEL - Abnormal; Notable for the following:    Sodium 131 (*)     Chloride 93 (*)     Glucose, Bld 115 (*)     GFR calc non Af Amer 65 (*)     GFR calc Af Amer 75 (*)     All other components within normal limits  PROTIME-INR - Abnormal; Notable for the following:    Prothrombin Time 18.2 (*)     INR 1.56 (*)  All other components within normal limits  URINALYSIS, ROUTINE W REFLEX MICROSCOPIC  LACTIC ACID, PLASMA  URINE CULTURE  CULTURE, BLOOD (ROUTINE X 2)  CULTURE, BLOOD (ROUTINE X 2)   Dg Chest 2 View  08/04/2012  *RADIOLOGY REPORT*  Clinical Data: Cough.  Right lower quadrant abdominal pain. History of lung cancer.  CHEST - 2 VIEW  Comparison: Chest radiograph performed 07/23/2011, and CT of the chest performed 07/01/2012  Findings: The lungs are mildly hypoexpanded.  Known hilar lymphadenopathy is better characterized on prior CT.  Mild vascular congestion is noted, and mild bibasilar airspace opacities, right greater than left, could reflect mild pneumonia.  No pleural effusion or pneumothorax is identified.  The heart is normal in size; the mediastinal contour is within normal limits.  A left-sided chest port is noted ending about the mid to distal SVC.  No acute osseous abnormalities are seen.  IMPRESSION: Lungs mildly hypoexpanded.  Mild bibasilar airspace opacities, right greater than left, raise concern for mild pneumonia.  Mild vascular congestion seen.  Known hilar lymphadenopathy is better characterized on prior CT.   Original Report Authenticated By: Tonia Ghent, M.D.    Nursing notes, applicable records and vitals reviewed.  Radiologic Images/Reports reviewed.   1. Community acquired pneumonia   2. Hypoxia   3. Metastatic cancer       MDM  Community-acquired pneumonia, near end-stage metastatic cancer. Patient is comfortable with treatment  given. He is not septic. He has not neutropenic. He would be a candidate for home discharge if he had oxygen at home. He will require an overnight stay to get oxygen set up at home.       I personally performed the services described in this documentation, which was scribed in my presence. The recorded information has been reviewed and is accurate.     Flint Melter, MD 08/04/12 [phone]

## 2012-08-04 ENCOUNTER — Other Ambulatory Visit (HOSPITAL_COMMUNITY): Payer: 59

## 2012-08-04 ENCOUNTER — Observation Stay (HOSPITAL_COMMUNITY): Payer: 59

## 2012-08-04 ENCOUNTER — Ambulatory Visit (HOSPITAL_COMMUNITY): Payer: 59 | Admitting: Oncology

## 2012-08-04 ENCOUNTER — Inpatient Hospital Stay (HOSPITAL_COMMUNITY): Payer: 59

## 2012-08-04 ENCOUNTER — Encounter (HOSPITAL_COMMUNITY): Payer: Self-pay | Admitting: Internal Medicine

## 2012-08-04 DIAGNOSIS — C7A090 Malignant carcinoid tumor of the bronchus and lung: Secondary | ICD-10-CM

## 2012-08-04 DIAGNOSIS — R0902 Hypoxemia: Secondary | ICD-10-CM

## 2012-08-04 DIAGNOSIS — J9600 Acute respiratory failure, unspecified whether with hypoxia or hypercapnia: Secondary | ICD-10-CM | POA: Diagnosis present

## 2012-08-04 DIAGNOSIS — R1011 Right upper quadrant pain: Secondary | ICD-10-CM | POA: Diagnosis present

## 2012-08-04 DIAGNOSIS — E871 Hypo-osmolality and hyponatremia: Secondary | ICD-10-CM | POA: Diagnosis present

## 2012-08-04 DIAGNOSIS — D6959 Other secondary thrombocytopenia: Secondary | ICD-10-CM

## 2012-08-04 DIAGNOSIS — D696 Thrombocytopenia, unspecified: Secondary | ICD-10-CM | POA: Diagnosis present

## 2012-08-04 DIAGNOSIS — C3490 Malignant neoplasm of unspecified part of unspecified bronchus or lung: Secondary | ICD-10-CM

## 2012-08-04 DIAGNOSIS — J189 Pneumonia, unspecified organism: Secondary | ICD-10-CM | POA: Diagnosis present

## 2012-08-04 LAB — CBC WITH DIFFERENTIAL/PLATELET
Eosinophils Absolute: 0.1 10*3/uL (ref 0.0–0.7)
Eosinophils Relative: 1 % (ref 0–5)
Lymphocytes Relative: 12 % (ref 12–46)
Lymphs Abs: 0.7 10*3/uL (ref 0.7–4.0)
MCH: 33.6 pg (ref 26.0–34.0)
Myelocytes: 0 %
Neutro Abs: 4.9 10*3/uL (ref 1.7–7.7)
Neutrophils Relative %: 79 % — ABNORMAL HIGH (ref 43–77)
Platelets: 55 10*3/uL — ABNORMAL LOW (ref 150–400)
RBC: 3.93 MIL/uL — ABNORMAL LOW (ref 4.22–5.81)
WBC: 6.2 10*3/uL (ref 4.0–10.5)
nRBC: 0 /100 WBC

## 2012-08-04 LAB — URINALYSIS, ROUTINE W REFLEX MICROSCOPIC
Bilirubin Urine: NEGATIVE
Glucose, UA: NEGATIVE mg/dL
Ketones, ur: NEGATIVE mg/dL
pH: 6.5 (ref 5.0–8.0)

## 2012-08-04 LAB — LACTIC ACID, PLASMA: Lactic Acid, Venous: 1.3 mmol/L (ref 0.5–2.2)

## 2012-08-04 LAB — BASIC METABOLIC PANEL
BUN: 20 mg/dL (ref 6–23)
CO2: 25 mEq/L (ref 19–32)
Chloride: 93 mEq/L — ABNORMAL LOW (ref 96–112)
Creatinine, Ser: 1.25 mg/dL (ref 0.50–1.35)
GFR calc Af Amer: 75 mL/min — ABNORMAL LOW (ref >90)

## 2012-08-04 LAB — PROTIME-INR: Prothrombin Time: 18.2 seconds — ABNORMAL HIGH (ref 11.6–15.2)

## 2012-08-04 MED ORDER — DEXTROSE 5 % IV SOLN
2.0000 g | Freq: Two times a day (BID) | INTRAVENOUS | Status: DC
  Filled 2012-08-04 (×2): qty 2

## 2012-08-04 MED ORDER — FENTANYL 25 MCG/HR TD PT72: 1.0000 | MEDICATED_PATCH | TRANSDERMAL | Status: DC

## 2012-08-04 MED ORDER — ACYCLOVIR 400 MG PO TABS
400.0000 mg | ORAL_TABLET | Freq: Two times a day (BID) | ORAL | Status: DC
  Filled 2012-08-04: qty 1

## 2012-08-04 MED ORDER — OXYCODONE HCL 5 MG PO TABS
5.0000 mg | ORAL_TABLET | ORAL | Status: DC | PRN
  Administered 2012-08-04: 5 mg via ORAL
  Filled 2012-08-04: qty 1

## 2012-08-04 MED ORDER — WARFARIN SODIUM 7.5 MG PO TABS: 7.5000 mg | ORAL_TABLET | Freq: Once | ORAL | Status: DC

## 2012-08-04 MED ORDER — FLUCONAZOLE 100 MG PO TABS
200.0000 mg | ORAL_TABLET | Freq: Every day | ORAL | Status: DC
  Administered 2012-08-04: 200 mg via ORAL
  Filled 2012-08-04: qty 2

## 2012-08-04 MED ORDER — FLUCONAZOLE 100 MG PO TABS: 200.0000 mg | ORAL_TABLET | Freq: Every day | ORAL | Status: AC

## 2012-08-04 MED ORDER — CLONAZEPAM 0.5 MG PO TABS: 0.5000 mg | ORAL_TABLET | Freq: Four times a day (QID) | ORAL | Status: DC | PRN

## 2012-08-04 MED ORDER — LEVETIRACETAM 500 MG PO TABS
500.0000 mg | ORAL_TABLET | Freq: Two times a day (BID) | ORAL | Status: DC
  Administered 2012-08-04: 500 mg via ORAL
  Filled 2012-08-04: qty 1

## 2012-08-04 MED ORDER — ACYCLOVIR 200 MG PO CAPS
400.0000 mg | ORAL_CAPSULE | Freq: Two times a day (BID) | ORAL | Status: DC
  Administered 2012-08-04: 400 mg via ORAL
  Filled 2012-08-04: qty 2

## 2012-08-04 MED ORDER — ONDANSETRON HCL 4 MG/2ML IJ SOLN: 4.0000 mg | INTRAMUSCULAR | Status: DC | PRN

## 2012-08-04 MED ORDER — DEXTROSE 5 % IV SOLN
2.0000 g | Freq: Once | INTRAVENOUS | Status: AC
  Administered 2012-08-04: 2 g via INTRAVENOUS
  Filled 2012-08-04: qty 2

## 2012-08-04 MED ORDER — POTASSIUM CHLORIDE IN NACL 20-0.9 MEQ/L-% IV SOLN
INTRAVENOUS | Status: DC
  Administered 2012-08-04: 06:00:00 via INTRAVENOUS

## 2012-08-04 MED ORDER — MAGIC MOUTHWASH: 5.0000 mL | Freq: Four times a day (QID) | ORAL | Status: DC | PRN

## 2012-08-04 MED ORDER — ACETAMINOPHEN 325 MG PO TABS: 650.0000 mg | ORAL_TABLET | ORAL | Status: DC | PRN

## 2012-08-04 MED ORDER — DEXTROSE 5 % IV SOLN
INTRAVENOUS | Status: AC
  Filled 2012-08-04: qty 2

## 2012-08-04 MED ORDER — WARFARIN - PHARMACIST DOSING INPATIENT: Freq: Every day | Status: DC

## 2012-08-04 MED ORDER — LEVOFLOXACIN 750 MG PO TABS: 750.0000 mg | ORAL_TABLET | Freq: Every day | ORAL | Status: AC

## 2012-08-04 MED ORDER — LEVOFLOXACIN IN D5W 750 MG/150ML IV SOLN
750.0000 mg | Freq: Once | INTRAVENOUS | Status: AC
  Administered 2012-08-04: 750 mg via INTRAVENOUS
  Filled 2012-08-04: qty 150

## 2012-08-04 MED ORDER — VANCOMYCIN HCL IN DEXTROSE 1-5 GM/200ML-% IV SOLN
1000.0000 mg | Freq: Three times a day (TID) | INTRAVENOUS | Status: DC
  Filled 2012-08-04 (×4): qty 200

## 2012-08-04 MED ORDER — FIRST-DUKES MOUTHWASH MT SUSP: 5.0000 mL | Freq: Four times a day (QID) | OROMUCOSAL | Status: DC | PRN

## 2012-08-04 MED ORDER — IOHEXOL 300 MG/ML  SOLN
100.0000 mL | Freq: Once | INTRAMUSCULAR | Status: AC | PRN
  Administered 2012-08-04: 100 mL via INTRAVENOUS

## 2012-08-04 MED ORDER — HYDROCORTISONE 20 MG PO TABS
40.0000 mg | ORAL_TABLET | Freq: Two times a day (BID) | ORAL | Status: DC
  Administered 2012-08-04: 40 mg via ORAL
  Filled 2012-08-04 (×5): qty 2

## 2012-08-04 MED ORDER — VANCOMYCIN HCL 1000 MG IV SOLR
1500.0000 mg | Freq: Once | INTRAVENOUS | Status: AC
  Administered 2012-08-04: 1500 mg via INTRAVENOUS
  Filled 2012-08-04: qty 1500

## 2012-08-04 MED ORDER — PANTOPRAZOLE SODIUM 40 MG PO TBEC
40.0000 mg | DELAYED_RELEASE_TABLET | Freq: Every day | ORAL | Status: DC
  Administered 2012-08-04: 40 mg via ORAL
  Filled 2012-08-04: qty 1

## 2012-08-04 MED ORDER — FENTANYL 25 MCG/HR TD PT72
25.0000 ug | MEDICATED_PATCH | TRANSDERMAL | Status: DC
  Administered 2012-08-04: 25 ug via TRANSDERMAL
  Filled 2012-08-04: qty 1

## 2012-08-04 NOTE — H&P (Signed)
[facility] History and Physical  [name] Conner  NFA:[id number]  DOB: 10/31/1960   DOA: 08/04/2012   PCP:   Kirk Ruths, MD   Chief Complaint:  Cough for 2 days  HPI: [name] Conner is an 51 y.o. male.   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 Because of the persistent coughing is now developed a lower abdominal pain for this reason came to the emergency room. He is also been having increasing weakness, and some degree of orthopnea. Denies worsening lower extremity edema.  Emergency room patient was found to be persistently hypoxic on room air in the 80s, and chest x-ray revealed bilateral pneumonia right greater than left. Hospitalist service was called to assist with management.  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 ;  Eyes: Negative for eye pain,  and discharge. ; Hemorrhage from coughing  ENMT: Negative for ear pain, hoarseness, nasal congestion, sinus pressure and sore throat. ;  Cardiovascular: Negative for chest pain, palpitations, diaphoresis,  and peripheral edema. ; dyspnea Respiratory: Negative for hemoptysis, wheezing and stridor. ;  Gastrointestinal: Negative for nausea, vomiting, diarrhea, constipation, abdominal pain, melena, blood in stool, hematemesis, jaundice and rectal bleeding. unusual weight loss..   Genitourinary: Negative for frequency, dysuria, incontinence,flank pain and hematuria; Musculoskeletal: Negative for back pain and neck pain. Negative for swelling and trauma.;  Skin: . Negative for pruritus, rash, abrasions,  bruising and skin lesion.; ulcerations Neuro: Negative for headache, lightheadedness and neck stiffness. Negative for weakness, altered level of consciousness , altered mental status, extremity weakness, burning feet, involuntary movement, seizure and syncope.  Psych: negative for anxiety, depression, insomnia, tearfulness, panic attacks, hallucinations, paranoia, suicidal or homicidal ideation    Past Medical History  Diagnosis Date  . Renal disorder     kidney stones  . PE (pulmonary embolism)   . Heart disease     [place] [facility]   . Normal cardiac stress test     "14 years ago," reported on 12/27/11  . Bone metastases 07/19/2011  . 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 Brain cancer   . Lung cancer     small cell lung    Past Surgical History  Procedure Date  . Kidney surgery 2010    rattach ureter after kidney stones  . Cardiac catheterization 2011  . Brain surgery nov 2012    tumor removed  . Lung biopsy   . Craniotomy for tumor 07/2011  . Porta-a-cath insertion 07/23/11   . Powerport   . Portacath placement 07/23/2011    Procedure: INSERTION PORT-A-CATH;  Surgeon: Dalia Heading;  Location: [facility];  Service: General;  Laterality: Left;  Power Port (Left chest) attached to 48F catheter in Left Subclavian    Medications:  HOME MEDS: Prior to Admission medications   Medication Sig Start Date End Date Taking? Authorizing Provider  acyclovir (ZOVIRAX) 400 MG tablet Take 400 mg by mouth 2 (two) times daily. #60 with prn refills    Yes Historical Provider, MD  Calcium Carbonate-Vitamin D (CALCIUM + D PO) Take 600 mg  by mouth daily. 600mg  and 400 iu of vit d   Yes Historical Provider, MD  Cholecalciferol (VITAMIN D-1000 MAX ST PO) Take 1 tablet by mouth daily.   Yes Historical Provider, MD  clonazePAM (KLONOPIN) 0.5 MG tablet Take 0.5 mg by mouth 4 (four) times daily as needed. #100 1 refill (pt not taking as of 07/07/12)   Yes Historical Provider, MD  denosumab  (XGEVA) 120 MG/1.7ML SOLN Inject 120 mg into the skin every 28 (twenty-eight) days.     Yes Historical Provider, MD  dexamethasone (DECADRON) 4 MG tablet Take 1 tablet (4 mg total) by mouth 2 (two) times daily with a meal. 07/07/12  Yes Jonna Coup, MD  Diphenhyd-Hydrocort-Nystatin (FIRST-DUKES MOUTHWASH) SUSP Use as directed 5 mLs in the mouth or throat 4 (four) times daily as needed. 07/23/12  Yes Ellouise Newer, PA  fluconazole (DIFLUCAN) 100 MG tablet Take 2 tablets (200 mg total) by mouth daily. Takes if he has white patches in throat and will take for 5 days. 07/23/12  Yes Ellouise Newer, PA  hydrocortisone (CORTEF) 20 MG tablet TAKE 2 TABLETS 2 TIMES A DAY ON SATURDAY AND SUNDAY, THEN TAKE 1 TABLET 2 TIMES A DAY STARTING ON MONDAY 06/26/12  Yes Ellouise Newer, PA  ibuprofen (ADVIL,MOTRIN) 200 MG tablet Take 200 mg by mouth every 6 (six) hours as needed.     Yes Historical Provider, MD  KLOR-CON M20 20 MEQ tablet take 1 tablet by mouth twice a day 06/26/12  Yes Ellouise Newer, PA  levETIRAcetam (KEPPRA) 500 MG tablet 500 mg 2 (two) times daily.  06/23/12  Yes Randall An, MD  lidocaine-prilocaine (EMLA) cream Apply topically as needed. 10/30/11 10/29/12 Yes Randall An, MD  ondansetron (ZOFRAN) 8 MG tablet Take 8 mg by mouth every 8 (eight) hours as needed. Takes after chemo treatment   Yes Historical Provider, MD  oxyCODONE (ROXICODONE) 5 MG immediate release tablet Take 1-2 tablets, PO every 6 hours as needed for Pain 04/22/12  Yes Randall An, MD  pantoprazole (PROTONIX) 40 MG tablet take 1 tablet once daily 06/11/12  Yes Ellouise Newer, PA  temazepam (RESTORIL) 15 MG capsule Take 1 - 2 tablets, PO, at HS 07/17/11  Yes Ellouise Newer, PA  warfarin (COUMADIN) 2 MG tablet Take 2 mg by mouth daily.   Yes Historical Provider, MD  dexamethasone (DECADRON) 4 MG tablet Take 8 mg by mouth. Starting the day after chemo, take 2 tablets in the am and 2 tablets in the pm for 2  days.    Historical Provider, MD  prochlorperazine (COMPAZINE) 25 MG suppository Place 1 suppository (25 mg total) rectally every 6 (six) hours as needed for nausea. 07/24/11 07/23/12  Randall An, MD  warfarin (COUMADIN) 2 MG tablet Take 1 tablet (2 mg total) by mouth daily. To take 5 mg alternating with 4 mg. 03/28/12   Randall An, MD  warfarin (COUMADIN) 4 MG tablet take 1 tablet once daily 07/03/12   Ellouise Newer, PA  warfarin (COUMADIN) 5 MG tablet Take 5 mg by mouth daily. 5 mg on 8/19, 8/20, and 8/21 then back to 4 mg daily 01/25/12   Randall An, MD     Allergies:  No Known Allergies  Social History:   reports that he has quit smoking. His smoking use included Cigarettes. He has a 68 pack-year smoking history. He quit smokeless tobacco use about 13 months ago. He reports that  he does not drink alcohol or use illicit drugs.  Family History: Family History  Problem Relation Age of Onset  . Diabetes Mother   . Hypertension Mother   . Hypotension Mother   . Heart attack Father   . Prostate cancer Maternal Grandfather   . Cancer Maternal Grandfather     prostate  . Lung cancer Paternal Grandfather   . Cancer Paternal Grandfather     skin, bone, lung     Physical Exam: Filed Vitals:   08/04/12 0200 08/04/12 0300 08/04/12 0303 08/04/12 0400  BP: 119/71 110/91  115/76  Pulse: 97 94  95  Temp:   98.2 F (36.8 C)   TempSrc:   Oral   Resp: 15 16  21   SpO2: 89% 89%  95%   Blood pressure 115/76, pulse 95, temperature 98.2 F (36.8 C), temperature source Oral, resp. rate 21, SpO2 95.00%.  GEN:  Pleasantpleasant but weak looking middle-aged Caucasian gentleman  lying in the stretcher; tries to be  cooperative with exam PSYCH:  alert and oriented x4; does not appear anxious or depressed; affect is appropriate. HEENT: Mucous membranes pink, dry  and anicteric; PERRLA; EOM intact; no cervical lymphadenopathy nor thyromegaly or carotid bruit; no JVD; Breasts::  Not examined CHEST WALL: No tenderness CHEST:  tachypneic,bibasilar crackles  HEART: Regular rate and rhythm; no murmurs rubs or gallops BACK: No kyphosis or scoliosis; no CVA tenderness ABDOMEN: Obese, soft non-tender; no masses, no organomegaly, normal abdominal bowel sounds; no pannus; no intertriginous candida. Rectal Exam: Not done EXTREMITIES:  age-appropriate arthropathy of the hands and knees; no edema; no ulcerations. Genitalia: not examined PULSES: 2+ and symmetric SKIN: Normal hydration;  non-itchy benign-looking erythematous macular rash in his  CNS: Cranial nerves 2-12 grossly intact no focal lateralizing neurologic deficit   Labs on Admission:  Basic Metabolic Panel:  Lab 08/03/12 1610 07/28/12 0854  NA 131* 132*  K 3.7 4.0  CL 93* 97  CO2 25 23  GLUCOSE 115* 99  BUN 20 25*  CREATININE 1.25 0.99  CALCIUM 9.6 8.7  MG -- --  PHOS -- --   Liver Function Tests:  Lab 07/28/12 0854  AST 67*  ALT 150*  ALKPHOS 64  BILITOT 0.4  PROT 6.4  ALBUMIN 3.2*   No results found for this basename: LIPASE:5,AMYLASE:5 in the last 168 hours No results found for this basename: AMMONIA:5 in the last 168 hours CBC:  Lab 08/03/12 2348 07/28/12 0854  WBC 6.2 1.9*  NEUTROABS 4.9 0.9*  HGB 13.2 12.2*  HCT 38.7* 35.9*  MCV 98.5 98.9  PLT 55* 26*   Cardiac Enzymes: No results found for this basename: CKTOTAL:5,CKMB:5,CKMBINDEX:5,TROPONINI:5 in the last 168 hours BNP: No components found with this basename: POCBNP:5 D-dimer: No components found with this basename: D-DIMER:5 CBG: No results found for this basename: GLUCAP:5 in the last 168 hours  Radiological Exams on Admission: Dg Chest 2 View  08/04/2012  *RADIOLOGY REPORT*  Clinical Data: Cough.  Right lower quadrant abdominal pain. History of lung cancer.  CHEST - 2 VIEW  Comparison: Chest radiograph performed 07/23/2011, and CT of the chest performed 07/01/2012  Findings: The lungs are mildly hypoexpanded.  Known hilar  lymphadenopathy is better characterized on prior CT.  Mild vascular congestion is noted, and mild bibasilar airspace opacities, right greater than left, could reflect mild pneumonia.  No pleural effusion or pneumothorax is identified.  The heart is normal in size; the mediastinal contour is within normal limits.  A left-sided chest  port is noted ending about the mid to distal SVC.  No acute osseous abnormalities are seen.  IMPRESSION: Lungs mildly hypoexpanded.  Mild bibasilar airspace opacities, right greater than left, raise concern for mild pneumonia.  Mild vascular congestion seen.  Known hilar lymphadenopathy is better characterized on prior CT.   Original Report Authenticated By: Tonia Ghent, M.D.        Assessment/Plan Present on Admission:  . Acute respiratory failure, due to  . HCAP (healthcare-associated pneumonia)  . Bone metastases . Neuroendocrine cancer . Hyponatremia . Thrombocytopenia   PLAN: Will start this gentleman on broad-spectrum antibiotic therapy for presumed hospital-acquired pneumonia, but will get a CT scan of his chest for further characterization. The lack of fever and lack of the white count may be due to his compromised immune status; however he may have some noninfectious process causing the changes seen on chest x-ray  Although he is receiving palliative care he confirms her status as a full code, and his wishes will be honored.  Other plans as per orders.  Code Status: FULL CODE  Family Communication: Discuss with the patient  Disposition Plan: Depending on response to therapy, and discussion with oncologist    Sennie Borden Nocturnist [facility] Pager [phone]   08/04/2012, 4:54 AM

## 2012-08-04 NOTE — Progress Notes (Signed)
ANTICOAGULATION CONSULT NOTE - Initial Consult  Pharmacy Consult for Warfarin Indication: pulmonary embolus history, chronic warfarin at home.  No Known Allergies  Patient Measurements: Height: 6\' 4"  (193 cm) Weight: 208 lb 8.9 oz (94.6 kg) IBW/kg (Calculated) : 86.8   Vital Signs: Temp: 97.9 F (36.6 C) (12/02 0507) Temp src: Oral (12/02 0507) BP: 113/73 mmHg (12/02 0507) Pulse Rate: 95  (12/02 0400)  Labs:  Basename 08/03/12 2348  HGB 13.2  HCT 38.7*  PLT 55*  APTT --  LABPROT 18.2*  INR 1.56*  HEPARINUNFRC --  CREATININE 1.25  CKTOTAL --  CKMB --  TROPONINI --    Estimated Creatinine Clearance: 85.8 ml/min (by C-G formula based on Cr of 1.25).   Medical History: Past Medical History  Diagnosis Date  . Renal disorder     kidney stones  . PE (pulmonary embolism)   . Heart disease     [facility]   . Normal cardiac stress test     "14 years ago," reported on 12/27/11  . Bone metastases 07/19/2011  . 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 Brain cancer   . Lung cancer     small cell lung    Medications:  Prescriptions prior to admission  Medication Sig Dispense Refill  . acyclovir (ZOVIRAX) 400 MG tablet Take 400 mg by mouth 2 (two) times daily. #60 with prn refills       . Calcium Carbonate-Vitamin D (CALCIUM + D PO) Take 600 mg by mouth daily. 600mg  and 400 iu of vit d      . Cholecalciferol (VITAMIN D-1000 MAX ST PO) Take 1 tablet by mouth daily.      . clonazePAM (KLONOPIN) 0.5 MG tablet Take 0.5 mg by mouth 4 (four) times daily as needed. #100 1 refill (pt not taking as of 07/07/12)      . denosumab (XGEVA) 120 MG/1.7ML SOLN Inject 120 mg into the skin every 28 (twenty-eight) days.        Marland Kitchen dexamethasone (DECADRON) 4 MG tablet Take 1 tablet (4 mg total) by mouth 2 (two) times daily with a meal.  60 tablet  0  . Diphenhyd-Hydrocort-Nystatin (FIRST-DUKES MOUTHWASH) SUSP Use as directed 5 mLs in the mouth or throat 4 (four) times daily as  needed.  400 mL  1  . fluconazole (DIFLUCAN) 100 MG tablet Take 2 tablets (200 mg total) by mouth daily. Takes if he has white patches in throat and will take for 5 days.  30 tablet  2  . hydrocortisone (CORTEF) 20 MG tablet TAKE 2 TABLETS 2 TIMES A DAY ON SATURDAY AND SUNDAY, THEN TAKE 1 TABLET 2 TIMES A DAY STARTING ON MONDAY  90 tablet  5  . ibuprofen (ADVIL,MOTRIN) 200 MG tablet Take 200 mg by mouth every 6 (six) hours as needed.        Marland Kitchen KLOR-CON M20 20 MEQ tablet take 1 tablet by mouth twice a day  60 each  5  . levETIRAcetam (KEPPRA) 500 MG tablet 500 mg 2 (two) times daily.       Marland Kitchen lidocaine-prilocaine (EMLA) cream Apply topically as needed.  30 g  2  . ondansetron (ZOFRAN) 8 MG tablet Take 8 mg by mouth every 8 (eight) hours as needed. Takes after chemo treatment      . oxyCODONE (ROXICODONE) 5 MG immediate release tablet Take 1-2 tablets, PO every 6 hours as needed for Pain  60 tablet  0  . pantoprazole (  PROTONIX) 40 MG tablet take 1 tablet once daily  30 tablet  3  . temazepam (RESTORIL) 15 MG capsule Take 1 - 2 tablets, PO, at HS  60 capsule  2  . warfarin (COUMADIN) 2 MG tablet Take 2 mg by mouth daily.      Marland Kitchen dexamethasone (DECADRON) 4 MG tablet Take 8 mg by mouth. Starting the day after chemo, take 2 tablets in the am and 2 tablets in the pm for 2 days.      . prochlorperazine (COMPAZINE) 25 MG suppository Place 1 suppository (25 mg total) rectally every 6 (six) hours as needed for nausea.  12 suppository  2  . warfarin (COUMADIN) 2 MG tablet Take 1 tablet (2 mg total) by mouth daily. To take 5 mg alternating with 4 mg.  30 tablet  3  . warfarin (COUMADIN) 4 MG tablet take 1 tablet once daily  30 tablet  2  . warfarin (COUMADIN) 5 MG tablet Take 5 mg by mouth daily. 5 mg on 8/19, 8/20, and 8/21 then back to 4 mg daily        Assessment: 51yo male with h/o cancer and PE.  Was on chronic coumadin at home.  Dose was reportedly 4mg  daily.  INR is sub-therapeutic on admission. Goal of  Therapy:  INR 2-3 Monitor platelets by anticoagulation protocol: Yes   Plan: Coumadin 7.5mg  today x 1 INR daily  Vladimir Lenhoff A 08/04/2012,7:53 AM

## 2012-08-04 NOTE — Progress Notes (Signed)
ANTIBIOTIC CONSULT NOTE - INITIAL  Pharmacy Consult for Vancomycin and Cefepime Indication: pneumonia  No Known Allergies  Patient Measurements: Height: 6\' 4"  (193 cm) Weight: 208 lb 8.9 oz (94.6 kg) IBW/kg (Calculated) : 86.8   Vital Signs: Temp: 97.9 F (36.6 C) (12/02 0507) Temp src: Oral (12/02 0507) BP: 113/73 mmHg (12/02 0507) Pulse Rate: 95  (12/02 0400) Intake/Output from previous day: 12/01 0701 - 12/02 0700 In: -  Out: 300 [Urine:300] Intake/Output from this shift:    Labs:  [facility] 08/03/12 2348  WBC 6.2  HGB 13.2  PLT 55*  LABCREA --  CREATININE 1.25   Estimated Creatinine Clearance: 85.8 ml/min (by C-G formula based on Cr of 1.25). No results found for this basename: VANCOTROUGH:2,VANCOPEAK:2,VANCORANDOM:2,GENTTROUGH:2,GENTPEAK:2,GENTRANDOM:2,TOBRATROUGH:2,TOBRAPEAK:2,TOBRARND:2,AMIKACINPEAK:2,AMIKACINTROU:2,AMIKACIN:2, in the last 72 hours   Microbiology: Recent Results (from the past 720 hour(s))  CULTURE, BLOOD (ROUTINE X 2)     Status: Normal (Preliminary result)   Collection Time   08/04/12  1:39 AM      Component Value Range Status Comment   Specimen Description Blood LEFT ANTECUBITAL   Final    Special Requests BOTTLES DRAWN AEROBIC AND ANAEROBIC 7CC   Final    Culture PENDING   Incomplete    Report Status PENDING   Incomplete   CULTURE, BLOOD (ROUTINE X 2)     Status: Normal (Preliminary result)   Collection Time   08/04/12  1:45 AM      Component Value Range Status Comment   Specimen Description Blood BLOOD RIGHT HAND   Final    Special Requests BOTTLES DRAWN AEROBIC AND ANAEROBIC 6CC   Final    Culture PENDING   Incomplete    Report Status PENDING   Incomplete     Medical History: Past Medical History  Diagnosis Date  . Renal disorder     kidney stones  . PE (pulmonary embolism)   . Heart disease     [facility]   . Normal cardiac stress test     "14 years ago," reported on 12/27/11  . Bone metastases 07/19/2011  .  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 Brain cancer   . Lung cancer     small cell lung    Medications:  Scheduled:    . acyclovir  400 mg Oral BID  . [COMPLETED] ceFEPime (MAXIPIME) IV  2 g Intravenous Once  . ceFEPime (MAXIPIME) IV  2 g Intravenous Q12H  . fluconazole  200 mg Oral Daily  . hydrocortisone  40 mg Oral BID  . [COMPLETED]  HYDROmorphone (DILAUDID) injection  1 mg Intravenous Once  . levETIRAcetam  500 mg Oral BID  . [COMPLETED] levofloxacin (LEVAQUIN) IV  750 mg Intravenous Once  . [COMPLETED] ondansetron (ZOFRAN) IV  4 mg Intravenous Once  . pantoprazole  40 mg Oral Daily  . [COMPLETED] sodium chloride  500 mL Intravenous Once  . [COMPLETED] vancomycin  1,500 mg Intravenous Once  . vancomycin  1,000 mg Intravenous Q8H  . [DISCONTINUED] acyclovir  400 mg Oral BID   Assessment: 51yo male with h/o cancer that has metastasized to bone and liver.  Pt c/o RUQ pain and lots of coughing which produces phlegm.  Admitted for Rx of pna with Vancomycin and Cefepime (received initial doses).  Pt has good renal fxn.  Estimated Creatinine Clearance: 85.8 ml/min (by C-G formula based on Cr of 1.25).  Goal of Therapy:  Vancomycin trough level 15-20 mcg/ml Eradicate infection.  Plan: Vancomycin 1gm iv q8hrs  Check trough at steady state Cefepime 2gm iv q12hrs Monitor labs, renal fxn, and cultures per protocol Duration of therapy per MD  Valrie Hart A 08/04/2012,7:48 AM

## 2012-08-04 NOTE — ED Notes (Signed)
No complaints from patient during orthostatic vital signs 

## 2012-08-04 NOTE — Consult Note (Signed)
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    He reported to the ED with a cough and hypoxia.  He was diagnosed with bibasilar pneumonia.  He was placed on IV antibiotics.  Part of the patient's work-up was CT of chest.  The full report follows, but from an oncologic standpoint, the patient's hilar lymphadenopathy has increased and this may be reactive or worsening of the patient's cancer.  However, his diffuse liver metastases are worse.  He received Day 1 and 8 of Topotecan chemotherapy on 07/07/2012 and 07/14/2012.  He is Day 28 of cycle 1. Day 15 of chemotherapy was deferred due to his counts not meeting treatment parameters.   His counts on 08/03/2012 would not have met treatment parameters either.  So with this recent infection, the patient would require yet another delay in therapy.    In light of the patient's admission for infection, with chemotherapy likely playing a part in this, and his recent CT findings, the patient is a candidate for Hospice.     [name] Conner reports that he is having RUQ abdominal pain.  This is likely secondary to the patient worsening liver metastases.  I think we will need to escalate his pain regimen and I will review his pain medications and dictate that in the plan below.   I personally reviewed and went over radiographic studies with the patient and his wife.  He appreciates this information.  With this information in hand, I recommended a Hospice consultation while in the hospital and DNR.  He is agreeable to both.  We had a long conversation  regarding Hospice.  Patient education was given regarding Hospice and the services they provide.  The patient understands that studies report that early enrollment in Hospice actually allows the patient to live longer compared to those who enroll in Hospice nearer to end of life.  Hospice will allow the patient to stay at home at end of life or go to a facility for end of life care.  At this point, the patient would like to remain at home.  Hospice provides the patient with a team of providers to help with care including physicians, nurses, aids, chaplains, and social workers.  Hospice's goal is to keep patients out of the hospital and and comfortable by controlling symptoms with medications.  The patient is certainly Hospice appropriate with a life expectancy of less than 6 months.  I have quoted him a life expectancy of 6-12 weeks (maybe less), but officially less than 6 months. The patient understands that he can be discharged from Hospice at anytime.     PAST MEDICAL HISTORY:   Past Medical History  Diagnosis Date  . Renal disorder     kidney stones  . PE (pulmonary embolism)   . Heart disease     [facility]   . Normal cardiac stress test     "14 years ago," reported on 12/27/11  . Bone metastases 07/19/2011  . 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  Brain cancer   . Lung cancer     small cell lung    ALLERGIES: No Known Allergies    MEDICATIONS: I have reviewed the patient's current medications.     PAST SURGICAL HISTORY Past Surgical History  Procedure Date  . Kidney surgery 2010    rattach ureter after kidney stones  . Cardiac catheterization 2011  . Brain surgery nov 2012    tumor removed  . Lung biopsy   . Craniotomy for tumor 07/2011  . Porta-a-cath insertion 07/23/11   . Powerport   . Portacath placement 07/23/2011    Procedure: INSERTION PORT-A-CATH;  Surgeon: Dalia Heading;  Location: [facility];  Service: General;  Laterality: Left;  Power Port (Left  chest) attached to 55F catheter in Left Subclavian    FAMILY HISTORY: Family History  Problem Relation Age of Onset  . Diabetes Mother   . Hypertension Mother   . Hypotension Mother   . Heart attack Father   . Prostate cancer Maternal Grandfather   . Cancer Maternal Grandfather     prostate  . Lung cancer Paternal Grandfather   . Cancer Paternal Grandfather     skin, bone, lung    SOCIAL HISTORY:  reports that he has quit smoking. His smoking use included Cigarettes. He has a 68 pack-year smoking history. He quit smokeless tobacco use about 13 months ago. He reports that he does not drink alcohol or use illicit drugs.  PERFORMANCE STATUS: The patient's performance status is 3 - Symptomatic, >50% confined to bed  PHYSICAL EXAM: Most Recent Vital Signs: Blood pressure 113/73, pulse 95, temperature 97.9 F (36.6 C), temperature source Oral, resp. rate 20, height 6\' 4"  (1.93 m), weight 208 lb 8.9 oz (94.6 kg), SpO2 95.00%. General appearance: alert, cooperative, appears stated age and no distress Head: Normocephalic, without obvious abnormality, atraumatic Eyes: conjunctivae/corneas clear. PERRL, EOM's intact. Fundi benign. Neck: no adenopathy, supple, symmetrical, trachea midline and thyroid not enlarged, symmetric, no tenderness/mass/nodules Lungs: rales bibasilar Heart: regular rate and rhythm, S1, S2 normal, no murmur, click, rub or gallop Abdomen: soft, non-tender; bowel sounds normal; no masses,  no organomegaly Extremities: extremities normal, atraumatic, no cyanosis or edema Skin: Skin color, texture, turgor normal. No rashes or lesions Neurologic: Grossly normal  LABORATORY DATA:  Results for orders placed during the hospital encounter of 08/03/12 (from the past 48 hour(s))  CBC WITH DIFFERENTIAL     Status: Abnormal   Collection Time   08/03/12 11:48 PM      Component Value Range Comment   WBC 6.2  4.0 - 10.5 K/uL    RBC 3.93 (*) 4.22 - 5.81 MIL/uL    Hemoglobin 13.2   13.0 - 17.0 g/dL    HCT 91.4 (*) 78.2 - 52.0 %    MCV 98.5  78.0 - 100.0 fL    MCH 33.6  26.0 - 34.0 pg    MCHC 34.1  30.0 - 36.0 g/dL    RDW 95.6 (*) 21.3 - 15.5 %    Platelets 55 (*) 150 - 400 K/uL    Neutrophils Relative 79 (*) 43 - 77 %    Lymphocytes Relative 12  12 - 46 %    Monocytes Relative 7  3 - 12 %    Eosinophils Relative 1  0 - 5 %    Basophils Relative 1  0 - 1 %    Band Neutrophils 0  0 - 10 %    Metamyelocytes Relative 0  Myelocytes 0      Promyelocytes Absolute 0      Blasts 0      nRBC 0  0 /100 WBC    Neutro Abs 4.9  1.7 - 7.7 K/uL    Lymphs Abs 0.7  0.7 - 4.0 K/uL    Monocytes Absolute 0.4  0.1 - 1.0 K/uL    Eosinophils Absolute 0.1  0.0 - 0.7 K/uL    Basophils Absolute 0.1  0.0 - 0.1 K/uL   BASIC METABOLIC PANEL     Status: Abnormal   Collection Time   08/03/12 11:48 PM      Component Value Range Comment   Sodium 131 (*) 135 - 145 mEq/L    Potassium 3.7  3.5 - 5.1 mEq/L    Chloride 93 (*) 96 - 112 mEq/L    CO2 25  19 - 32 mEq/L    Glucose, Bld 115 (*) 70 - 99 mg/dL    BUN 20  6 - 23 mg/dL    Creatinine, Ser 4.54  0.50 - 1.35 mg/dL    Calcium 9.6  8.4 - 09.8 mg/dL    GFR calc non Af Amer 65 (*) >90 mL/min    GFR calc Af Amer 75 (*) >90 mL/min   PROTIME-INR     Status: Abnormal   Collection Time   08/03/12 11:48 PM      Component Value Range Comment   Prothrombin Time 18.2 (*) 11.6 - 15.2 seconds    INR 1.56 (*) 0.00 - 1.49   URINALYSIS, ROUTINE W REFLEX MICROSCOPIC     Status: Normal   Collection Time   08/04/12 12:41 AM      Component Value Range Comment   Color, Urine YELLOW  YELLOW    APPearance CLEAR  CLEAR    Specific Gravity, Urine 1.020  1.005 - 1.030    pH 6.5  5.0 - 8.0    Glucose, UA NEGATIVE  NEGATIVE mg/dL    Hgb urine dipstick NEGATIVE  NEGATIVE    Bilirubin Urine NEGATIVE  NEGATIVE    Ketones, ur NEGATIVE  NEGATIVE mg/dL    Protein, ur NEGATIVE  NEGATIVE mg/dL    Urobilinogen, UA 0.2  0.0 - 1.0 mg/dL    Nitrite NEGATIVE   NEGATIVE    Leukocytes, UA NEGATIVE  NEGATIVE MICROSCOPIC NOT DONE ON URINES WITH NEGATIVE PROTEIN, BLOOD, LEUKOCYTES, NITRITE, OR GLUCOSE <1000 mg/dL.  CULTURE, BLOOD (ROUTINE X 2)     Status: Normal (Preliminary result)   Collection Time   08/04/12  1:39 AM      Component Value Range Comment   Specimen Description Blood LEFT ANTECUBITAL      Special Requests BOTTLES DRAWN AEROBIC AND ANAEROBIC 7CC      Culture PENDING      Report Status PENDING     LACTIC ACID, PLASMA     Status: Normal   Collection Time   08/04/12  1:40 AM      Component Value Range Comment   Lactic Acid, Venous 1.3  0.5 - 2.2 mmol/L   CULTURE, BLOOD (ROUTINE X 2)     Status: Normal (Preliminary result)   Collection Time   08/04/12  1:45 AM      Component Value Range Comment   Specimen Description Blood BLOOD RIGHT HAND      Special Requests BOTTLES DRAWN AEROBIC AND ANAEROBIC 6CC      Culture PENDING      Report Status PENDING  RADIOGRAPHY: Dg Chest 2 View  08/04/2012  *RADIOLOGY REPORT*  Clinical Data: Cough.  Right lower quadrant abdominal pain. History of lung cancer.  CHEST - 2 VIEW  Comparison: Chest radiograph performed 07/23/2011, and CT of the chest performed 07/01/2012  Findings: The lungs are mildly hypoexpanded.  Known hilar lymphadenopathy is better characterized on prior CT.  Mild vascular congestion is noted, and mild bibasilar airspace opacities, right greater than left, could reflect mild pneumonia.  No pleural effusion or pneumothorax is identified.  The heart is normal in size; the mediastinal contour is within normal limits.  A left-sided chest port is noted ending about the mid to distal SVC.  No acute osseous abnormalities are seen.  IMPRESSION: Lungs mildly hypoexpanded.  Mild bibasilar airspace opacities, right greater than left, raise concern for mild pneumonia.  Mild vascular congestion seen.  Known hilar lymphadenopathy is better characterized on prior CT.   Original Report Authenticated  By: Tonia Ghent, M.D.    Ct Chest W Contrast  08/04/2012  *RADIOLOGY REPORT*  Clinical Data: Significant cough.  History of cancer with bone mets.  CT CHEST WITH CONTRAST  Technique:  Multidetector CT imaging of the chest was performed following the standard protocol during bolus administration of intravenous contrast.  Contrast: OMNIPAQUE IOHEXOL 300 MG/ML  SOLN  Comparison: Chest radiograph performed earlier today at 12:25 a.m., and CT of the chest performed 07/01/2012  Findings: There is dense consolidation within both lower lung lobes, concerning for multifocal pneumonia.  A 5 mm nodule is again noted along the superior aspect of the right lower lobe.  Diffuse bilateral emphysematous change is seen, most prominent at the upper lung lobes.  No pleural effusion or pneumothorax is identified.  Significant bilateral hilar lymphadenopathy is again noted, more prominent on the right.  These nodes are increased in size from the prior study.  No significant mediastinal lymphadenopathy is seen. The great vessels are grossly unremarkable in appearance.  No pericardial effusion is identified.  The patient's left-sided chest port is noted ending about the distal SVC.  The thyroid gland is unremarkable in appearance.  No axillary lymphadenopathy is seen.  Innumerable hepatic metastases are again identified, diffusely increased in size from the prior study.  The spleen is unremarkable in appearance.  Scattered centrally necrotic nodes are noted about the gastroesophageal junction.  No acute osseous abnormalities are identified.  IMPRESSION:  1.  Dense consolidation in both lower lung lobes, concerning for multifocal pneumonia. 2.  Diffuse bilateral emphysematous change, most prominent at the upper lung lobes; 5 mm nodule again noted at the right lower lobe. 3.  Increased prominence of bilateral hilar lymphadenopathy, more prominent on the right. 4.  Innumerable hepatic metastases have diffusely increased in size  from the prior study; centrally necrotic nodes about the gastroesophageal junction are also mildly increased in size.   Original Report Authenticated By: Tonia Ghent, M.D.         ASSESSMENT:  1. Metastatic recurrent small cell carcinoma lung 2. Bibasilar pneumonia 3. Hypoxia 4. Dyspnea 5. Thrombocytopenia, secondary to chemotherapy.  6. RUQ abdominal pain, secondary to worsening liver metastases.   PLAN:  1. Will transition the patient to Fentanyl 25 mcg/hr patch every 72 hours.  May titrate as needed.  2. Continue with PO Oxycodone every 4 hours PRN breakthrough pain management 3. I personally reviewed and went over radiographic studies with the patient. 4. DNR  5. Hospice referral 6. Recommend Lovenox prophylaxis dose while in the hospital. 7. Recommend D/C  Coumadin due to worsening liver metastases. 8. When discharged from hospital, do not recommend any anticoagulation.  9. Will continue to follow while an inpatient.  When discharged, recommend a 4 week follow-up appointment at the [facility] if patient wishes and is able.  10. Patient discussed with Dr. Lendell Caprice  All questions were answered. The patient knows to call the clinic with any problems, questions or concerns. We can certainly see the patient much sooner if necessary.  Patient and plan will be discussed with Dr. Mariel Sleet in the near future.  Virginie Josten

## 2012-08-04 NOTE — Care Management Note (Signed)
    Page 1 of 1   08/04/2012     3:17:27 PM   CARE MANAGEMENT NOTE 08/04/2012  Patient:  [name] Conner, [name] Conner   Account Number:  [id number]  Date Initiated:  08/04/2012  Documentation initiated by:  Sharrie Rothman  Subjective/Objective Assessment:   Pt admitted from home with bilateral pneumonia and metatstatic lung CA. Pt lives with his wife and will return home at discharge. Pt is fairly indpendent at this time but does have a cane and walker for home use. Pt is now agreeing to     Action/Plan:   Hospice in the home. Referral called to [facility]. Pt is potential discharge today.   Anticipated DC Date:  08/05/2012   Anticipated DC Plan:  HOME W HOSPICE CARE      DC Planning Services  CM consult      PAC Choice  HOSPICE   Choice offered to / List presented to:  C-1 Patient           HH agency  HOSPICE   Status of service:  Completed, signed off Medicare Important Message given?   (If response is "NO", the following Medicare IM given date fields will be blank) Date Medicare IM given:   Date Additional Medicare IM given:    Discharge Disposition:  HOME W HOSPICE CARE  Per UR Regulation:    If discussed at Long Length of Stay Meetings, dates discussed:    Comments:  08/04/12 1515 Arlyss Queen, RN BSN CM Pt to be discharged home today with [facility]. Hospice has arranged home O2 to be delivered to pts home at 1700 and pts wife is aware to bring portable O2 to hospital to pick pt up. No other CM needs noted.  08/04/12 1053 Arlyss Queen, RN BSN CM

## 2012-08-04 NOTE — Discharge Summary (Signed)
Physician Discharge Summary  Patient ID: [name] Conner MRN: [id number] DOB/AGE: [birth year] 51 y.o.  Admit date: 08/03/2012 Discharge date: 08/04/2012  Discharge Diagnoses:  Principal Problem:  *HCAP (healthcare-associated pneumonia) Active Problems:  Metastatic lung cancer, failing chemotherapy, now hospice/comfort care.  Thrombocytopenia  Acute respiratory failure  RUQ pain History of PE (pulmonary embolism)  Hyponatremia     Medication List     As of 08/04/2012  2:46 PM    STOP taking these medications         acyclovir 400 MG tablet   Commonly known as: ZOVIRAX      CALCIUM + D PO      dexamethasone 4 MG tablet   Commonly known as: DECADRON      KLOR-CON M20 20 MEQ tablet   Generic drug: potassium chloride SA      lidocaine-prilocaine cream   Commonly known as: EMLA      VITAMIN D-1000 MAX ST PO      warfarin 2 MG tablet   Commonly known as: COUMADIN      XGEVA 120 MG/1.7ML Soln   Generic drug: denosumab      TAKE these medications         clonazePAM 0.5 MG tablet   Commonly known as: KLONOPIN   Take 0.5 mg by mouth 4 (four) times daily as needed. #100 1 refill (pt not taking as of 07/07/12)      fentaNYL 25 MCG/HR   Commonly known as: DURAGESIC - dosed mcg/hr   Place 1 patch (25 mcg total) onto the skin every 3 (three) days.      FIRST-DUKES MOUTHWASH Susp   Use as directed 5 mLs in the mouth or throat 4 (four) times daily as needed.      fluconazole 100 MG tablet   Commonly known as: DIFLUCAN   Take 2 tablets (200 mg total) by mouth daily. As needed for thrush      hydrocortisone 20 MG tablet   Commonly known as: CORTEF   TAKE 2 TABLETS 2 TIMES A DAY ON SATURDAY AND SUNDAY, THEN TAKE 1 TABLET 2 TIMES A DAY STARTING ON MONDAY      ibuprofen 200 MG tablet   Commonly known as: ADVIL,MOTRIN   Take 200 mg by mouth every 6 (six) hours as needed.      levETIRAcetam 500 MG tablet   Commonly known as: KEPPRA   500 mg 2 (two) times daily.     levofloxacin 750 MG tablet   Commonly known as: LEVAQUIN   Take 1 tablet (750 mg total) by mouth daily.      ondansetron 8 MG tablet   Commonly known as: ZOFRAN   Take 8 mg by mouth every 8 (eight) hours as needed. Takes after chemo treatment      oxyCODONE 5 MG immediate release tablet   Commonly known as: Oxy IR/ROXICODONE   Take 1-2 tablets, PO every 6 hours as needed for Pain      pantoprazole 40 MG tablet   Commonly known as: PROTONIX   take 1 tablet once daily      prochlorperazine 25 MG suppository   Commonly known as: COMPAZINE   Place 1 suppository (25 mg total) rectally every 6 (six) hours as needed for nausea.      temazepam 15 MG capsule   Commonly known as: RESTORIL   Take 1 - 2 tablets, PO, at HS       2 liters oxygen by [place]  Discharge Orders      Future Orders Please Complete By Expires   Diet general      Activity as tolerated - No restrictions         Follow-up Information    Follow up with Kirk Ruths, MD. (As needed)    Contact information:   [address] [place]  [address] [phone]          Disposition: 01-Home or Self Care with hospice  Discharged Condition: stable  Consults: Treatment Team:  Randall An, MD  Labs:   Results for orders placed during the hospital encounter of 08/03/12 (from the past 48 hour(s))  CBC WITH DIFFERENTIAL     Status: Abnormal   Collection Time   08/03/12 11:48 PM      Component Value Range Comment   WBC 6.2  4.0 - 10.5 K/uL    RBC 3.93 (*) 4.22 - 5.81 MIL/uL    Hemoglobin 13.2  13.0 - 17.0 g/dL    HCT 82.9 (*) 56.2 - 52.0 %    MCV 98.5  78.0 - 100.0 fL    MCH 33.6  26.0 - 34.0 pg    MCHC 34.1  30.0 - 36.0 g/dL    RDW 13.0 (*) 86.5 - 15.5 %    Platelets 55 (*) 150 - 400 K/uL    Neutrophils Relative 79 (*) 43 - 77 %    Lymphocytes Relative 12  12 - 46 %    Monocytes Relative 7  3 - 12 %    Eosinophils Relative 1  0 - 5 %    Basophils Relative 1  0 -  1 %    Band Neutrophils 0  0 - 10 %    Metamyelocytes Relative 0      Myelocytes 0      Promyelocytes Absolute 0      Blasts 0      nRBC 0  0 /100 WBC    Neutro Abs 4.9  1.7 - 7.7 K/uL    Lymphs Abs 0.7  0.7 - 4.0 K/uL    Monocytes Absolute 0.4  0.1 - 1.0 K/uL    Eosinophils Absolute 0.1  0.0 - 0.7 K/uL    Basophils Absolute 0.1  0.0 - 0.1 K/uL   BASIC METABOLIC PANEL     Status: Abnormal   Collection Time   08/03/12 11:48 PM      Component Value Range Comment   Sodium 131 (*) 135 - 145 mEq/L    Potassium 3.7  3.5 - 5.1 mEq/L    Chloride 93 (*) 96 - 112 mEq/L    CO2 25  19 - 32 mEq/L    Glucose, Bld 115 (*) 70 - 99 mg/dL    BUN 20  6 - 23 mg/dL    Creatinine, Ser 7.84  0.50 - 1.35 mg/dL    Calcium 9.6  8.4 - 69.6 mg/dL    GFR calc non Af Amer 65 (*) >90 mL/min    GFR calc Af Amer 75 (*) >90 mL/min   PROTIME-INR     Status: Abnormal   Collection Time   08/03/12 11:48 PM      Component Value Range Comment   Prothrombin Time 18.2 (*) 11.6 - 15.2 seconds    INR 1.56 (*) 0.00 - 1.49   URINALYSIS, ROUTINE W REFLEX MICROSCOPIC     Status: Normal   Collection Time   08/04/12 12:41 AM  Component Value Range Comment   Color, Urine YELLOW  YELLOW    APPearance CLEAR  CLEAR    Specific Gravity, Urine 1.020  1.005 - 1.030    pH 6.5  5.0 - 8.0    Glucose, UA NEGATIVE  NEGATIVE mg/dL    Hgb urine dipstick NEGATIVE  NEGATIVE    Bilirubin Urine NEGATIVE  NEGATIVE    Ketones, ur NEGATIVE  NEGATIVE mg/dL    Protein, ur NEGATIVE  NEGATIVE mg/dL    Urobilinogen, UA 0.2  0.0 - 1.0 mg/dL    Nitrite NEGATIVE  NEGATIVE    Leukocytes, UA NEGATIVE  NEGATIVE MICROSCOPIC NOT DONE ON URINES WITH NEGATIVE PROTEIN, BLOOD, LEUKOCYTES, NITRITE, OR GLUCOSE <1000 mg/dL.  CULTURE, BLOOD (ROUTINE X 2)     Status: Normal (Preliminary result)   Collection Time   08/04/12  1:39 AM      Component Value Range Comment   Specimen Description Blood LEFT ANTECUBITAL      Special Requests BOTTLES DRAWN AEROBIC  AND ANAEROBIC 7CC      Culture NO GROWTH <24 HRS      Report Status PENDING     LACTIC ACID, PLASMA     Status: Normal   Collection Time   08/04/12  1:40 AM      Component Value Range Comment   Lactic Acid, Venous 1.3  0.5 - 2.2 mmol/L   CULTURE, BLOOD (ROUTINE X 2)     Status: Normal (Preliminary result)   Collection Time   08/04/12  1:45 AM      Component Value Range Comment   Specimen Description Blood BLOOD RIGHT HAND      Special Requests BOTTLES DRAWN AEROBIC AND ANAEROBIC 6CC      Culture NO GROWTH <24 HRS      Report Status PENDING       Diagnostics:  Dg Chest 2 View  08/04/2012  *RADIOLOGY REPORT*  Clinical Data: Cough.  Right lower quadrant abdominal pain. History of lung cancer.  CHEST - 2 VIEW  Comparison: Chest radiograph performed 07/23/2011, and CT of the chest performed 07/01/2012  Findings: The lungs are mildly hypoexpanded.  Known hilar lymphadenopathy is better characterized on prior CT.  Mild vascular congestion is noted, and mild bibasilar airspace opacities, right greater than left, could reflect mild pneumonia.  No pleural effusion or pneumothorax is identified.  The heart is normal in size; the mediastinal contour is within normal limits.  A left-sided chest port is noted ending about the mid to distal SVC.  No acute osseous abnormalities are seen.  IMPRESSION: Lungs mildly hypoexpanded.  Mild bibasilar airspace opacities, right greater than left, raise concern for mild pneumonia.  Mild vascular congestion seen.  Known hilar lymphadenopathy is better characterized on prior CT.   Original Report Authenticated By: Tonia Ghent, M.D.    Ct Chest W Contrast  08/04/2012  *RADIOLOGY REPORT*  Clinical Data: Significant cough.  History of cancer with bone mets.  CT CHEST WITH CONTRAST  Technique:  Multidetector CT imaging of the chest was performed following the standard protocol during bolus administration of intravenous contrast.  Contrast: OMNIPAQUE IOHEXOL 300 MG/ML   SOLN  Comparison: Chest radiograph performed earlier today at 12:25 a.m., and CT of the chest performed 07/01/2012  Findings: There is dense consolidation within both lower lung lobes, concerning for multifocal pneumonia.  A 5 mm nodule is again noted along the superior aspect of the right lower lobe.  Diffuse bilateral emphysematous change is seen, most prominent  at the upper lung lobes.  No pleural effusion or pneumothorax is identified.  Significant bilateral hilar lymphadenopathy is again noted, more prominent on the right.  These nodes are increased in size from the prior study.  No significant mediastinal lymphadenopathy is seen. The great vessels are grossly unremarkable in appearance.  No pericardial effusion is identified.  The patient's left-sided chest port is noted ending about the distal SVC.  The thyroid gland is unremarkable in appearance.  No axillary lymphadenopathy is seen.  Innumerable hepatic metastases are again identified, diffusely increased in size from the prior study.  The spleen is unremarkable in appearance.  Scattered centrally necrotic nodes are noted about the gastroesophageal junction.  No acute osseous abnormalities are identified.  IMPRESSION:  1.  Dense consolidation in both lower lung lobes, concerning for multifocal pneumonia. 2.  Diffuse bilateral emphysematous change, most prominent at the upper lung lobes; 5 mm nodule again noted at the right lower lobe. 3.  Increased prominence of bilateral hilar lymphadenopathy, more prominent on the right. 4.  Innumerable hepatic metastases have diffusely increased in size from the prior study; centrally necrotic nodes about the gastroesophageal junction are also mildly increased in size.   Original Report Authenticated By: Tonia Ghent, M.D.     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 He presented with shortness of breath and cough for several days. The cough was productive of greenish sputum. He has also developed right upper quadrant pain with coughing and deep inspiration. He feels weak. He had an oxygen saturation in the emergency room in the 80s. Chest x-ray showed bilateral pneumonia. CT of the chest was done which showed multilobar pneumonia, increased mediastinal adenopathy, increased size and number of liver metastases, and centrally necrotic nodes about the GE junction. Patient was admitted and started on broad-spectrum antibiotics. Oncology was consulted. Do to patient's lack of response to chemotherapy, oncology recommended hospice. They have added fentanyl patch to his regimen. The patient wishes to go home today. I will arrange home hospice, home oxygen, give 5 days of levofloxacin. He has a history of thromboembolic disease. He had been on Coumadin prior to admission. This should be stopped in the setting of worsening liver metastases. Other medications changes listed above. He is now DO NOT RESUSCITATE. Oncology feels his prognosis is 6-8 weeks, certainly less than 6 months.  Discharge Exam:  Blood pressure 122/74, pulse 95, temperature 97.5 F (36.4 C), temperature source Oral, resp. rate 20, height 6\' 4"  (1.93 m), weight 94.6 kg (208 lb 8.9 oz), SpO2 94.00%.  Gen.: Appears comfortable. Asleep. Arousable. Appropriate. Lungs: Splinting. No wheezes rhonchi or rales but poor inspiratory effort. Cardiovascular regular rate rhythm without murmurs gallops rubs Extremities no clubbing cyanosis or edema Abdomen right upper culture and tender.  SignedChristiane Ha 08/04/2012, 2:46 PM

## 2012-08-04 NOTE — Plan of Care (Signed)
Problem: Discharge Progression Outcomes Goal: Other Discharge Outcomes/Goals Outcome: Completed/Met Date Met:  08/04/12 Pt being discharged home with hospice

## 2012-08-04 NOTE — Progress Notes (Signed)
IV removed, site WNL.  Pt given d/c instructions and new prescriptions.  Discussed home care with patient and discussed home medications, patient verbalizes understanding, teachback completed. Pt has hospice arranged and has home O2 to wear at time of d/c. F/U appointments in place, pt states they will keep appointment. Pt is stable at this time. Pt taken to main entrance in wheelchair by staff member.

## 2012-08-04 NOTE — Progress Notes (Signed)
UR chart review completed.  

## 2012-08-04 NOTE — Progress Notes (Signed)
ANTIBIOTIC CONSULT NOTE-Preliminary  Pharmacy Consult for cefepime and vancomycin Indication: pneumonia  No Known Allergies  Patient Measurements:  Weight from 11/25 - 212 lb  Vital Signs: Temp: 98.2 F (36.8 C) (12/02 0303) Temp src: Oral (12/02 0303) BP: 115/76 mmHg (12/02 0400) Pulse Rate: 95  (12/02 0400)  Labs:  Basename 08/03/12 2348  WBC 6.2  HGB 13.2  PLT 55*  LABCREA --  CREATININE 1.25   Estimated CrCl ~75 ml/min  Microbiology: No results found for this or any previous visit (from the past 720 hour(s)).  Medical History: Past Medical History  Diagnosis Date  . Renal disorder     kidney stones  . PE (pulmonary embolism)   . Heart disease     [facility]   . Normal cardiac stress test     "14 years ago," reported on 12/27/11  . Bone metastases 07/19/2011  . 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 Brain cancer   . Lung cancer     small cell lung   Assessment: Pt is a 51 yo M being initiated on cefepime and vancomycin for pneumonia.   Goal of Therapy:  Vancomycin trough level 15-20 mcg/ml  Plan:  1. Vancomycin 1500 mg IV x 1 2. Cefepime 2g IV x 1  Preliminary review of pertinent patient information completed.  Protocol will be initiated with a one-time dose(s).  Jeani Hawking clinical pharmacist will complete review during morning rounds to assess patient and finalize treatment regimen.  [name] Conner, PHARMD 08/04/2012,4:38 AM

## 2012-08-04 NOTE — ED Notes (Signed)
Patient transported to CT 

## 2012-08-05 ENCOUNTER — Ambulatory Visit (HOSPITAL_COMMUNITY): Payer: 59 | Admitting: Oncology

## 2012-08-05 LAB — URINE CULTURE: Colony Count: NO GROWTH

## 2012-08-05 LAB — RESPIRATORY VIRUS PANEL
Adenovirus: NOT DETECTED
Influenza A H1: NOT DETECTED
Influenza A H3: NOT DETECTED
Influenza A: NOT DETECTED
Metapneumovirus: NOT DETECTED
Parainfluenza 3: NOT DETECTED
Respiratory Syncytial Virus A: NOT DETECTED

## 2012-08-05 LAB — LEGIONELLA ANTIGEN, URINE

## 2012-08-06 ENCOUNTER — Ambulatory Visit (HOSPITAL_COMMUNITY): Payer: 59 | Admitting: Oncology

## 2012-08-08 ENCOUNTER — Inpatient Hospital Stay (HOSPITAL_COMMUNITY): Payer: 59

## 2012-08-09 LAB — CULTURE, BLOOD (ROUTINE X 2): Culture: NO GROWTH

## 2012-08-11 ENCOUNTER — Ambulatory Visit (HOSPITAL_COMMUNITY): Payer: 59 | Admitting: Oncology

## 2012-08-11 ENCOUNTER — Inpatient Hospital Stay (HOSPITAL_COMMUNITY): Payer: 59

## 2012-08-18 ENCOUNTER — Inpatient Hospital Stay (HOSPITAL_COMMUNITY): Payer: 59

## 2012-08-21 ENCOUNTER — Ambulatory Visit: Payer: 59 | Admitting: Radiation Oncology

## 2012-08-22 ENCOUNTER — Inpatient Hospital Stay (HOSPITAL_COMMUNITY): Payer: 59

## 2012-08-22 NOTE — Telephone Encounter (Signed)
closed

## 2012-08-25 ENCOUNTER — Inpatient Hospital Stay (HOSPITAL_COMMUNITY): Payer: 59

## 2012-09-01 ENCOUNTER — Inpatient Hospital Stay (HOSPITAL_COMMUNITY): Payer: 59

## 2012-09-05 ENCOUNTER — Inpatient Hospital Stay (HOSPITAL_COMMUNITY): Payer: 59

## 2012-09-08 ENCOUNTER — Inpatient Hospital Stay (HOSPITAL_COMMUNITY): Payer: 59

## 2012-09-09 ENCOUNTER — Other Ambulatory Visit (HOSPITAL_COMMUNITY): Payer: Self-pay | Admitting: Oncology

## 2012-09-09 DIAGNOSIS — IMO0001 Reserved for inherently not codable concepts without codable children: Secondary | ICD-10-CM

## 2012-09-09 DIAGNOSIS — C7949 Secondary malignant neoplasm of other parts of nervous system: Secondary | ICD-10-CM

## 2012-09-09 DIAGNOSIS — C7931 Secondary malignant neoplasm of brain: Secondary | ICD-10-CM

## 2012-09-09 MED ORDER — FENTANYL 100 MCG/HR TD PT72: 1.0000 | MEDICATED_PATCH | TRANSDERMAL | Status: AC

## 2012-09-11 NOTE — Progress Notes (Signed)
Error--QI review, intended to do chart review not addendum.

## 2012-09-15 ENCOUNTER — Ambulatory Visit (HOSPITAL_COMMUNITY): Payer: 59 | Admitting: Oncology

## 2012-09-15 ENCOUNTER — Inpatient Hospital Stay (HOSPITAL_COMMUNITY): Payer: 59

## 2012-09-22 ENCOUNTER — Inpatient Hospital Stay (HOSPITAL_COMMUNITY): Payer: 59

## 2012-09-29 ENCOUNTER — Ambulatory Visit (HOSPITAL_COMMUNITY): Payer: 59 | Admitting: Oncology

## 2012-10-04 DEATH — deceased

## 2012-10-09 IMAGING — CT CT HEAD WO/W CM
1 of 2 series · 13 of 30 positions shown, 17 images · IV contrast (omnipaque)
Comparison: None.

CLINICAL DATA: Cranial mass right temporal region.  Right-sided
headaches.   Palpable mass for 3 months.

CT HEAD WITHOUT AND WITH CONTRAST
TECHNIQUE: Contiguous axial images were obtained from the base of
the skull through the vertex without and with intravenous contrast.
Contrast: 75mL OMNIPAQUE IOHEXOL 300 MG/ML IV SOLN

[Series 2: head w/o 4.8 h37s · axial · non-contrast · 0.49mm/px · z∈[+1195,+1340]mm · 13 of 36 slices shown, 17 images]
[im 3/36  brain]
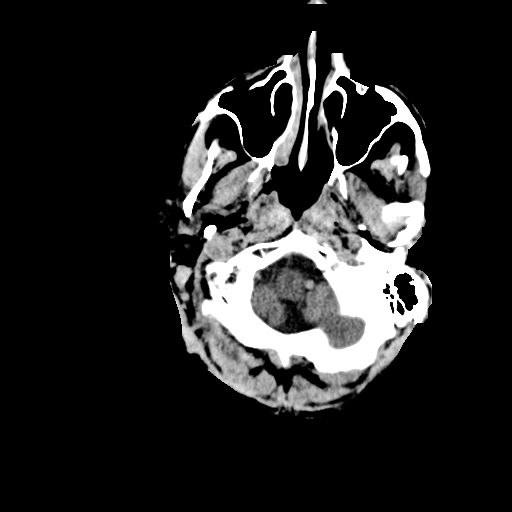
[im 3/36  bone]
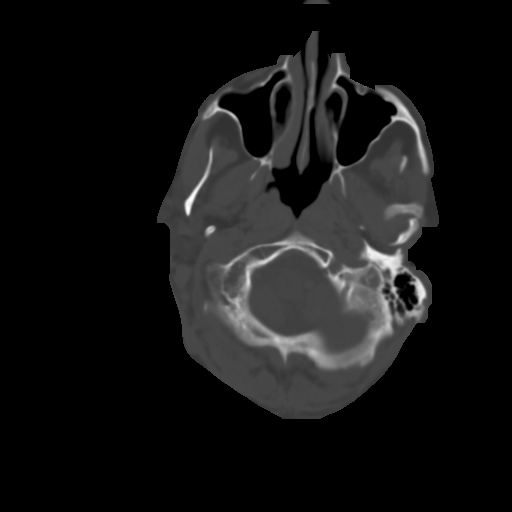
[im 6/36  brain]
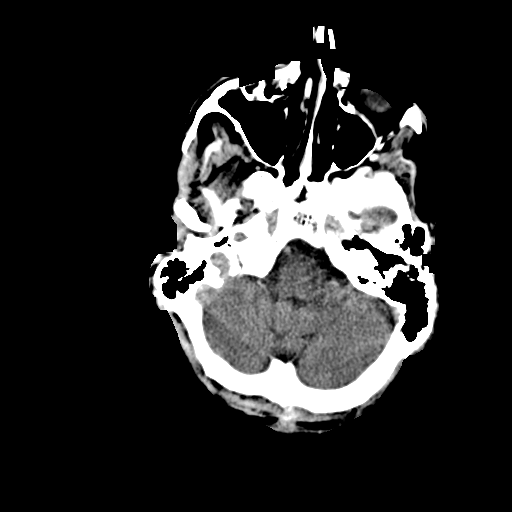
[im 8/36  brain]
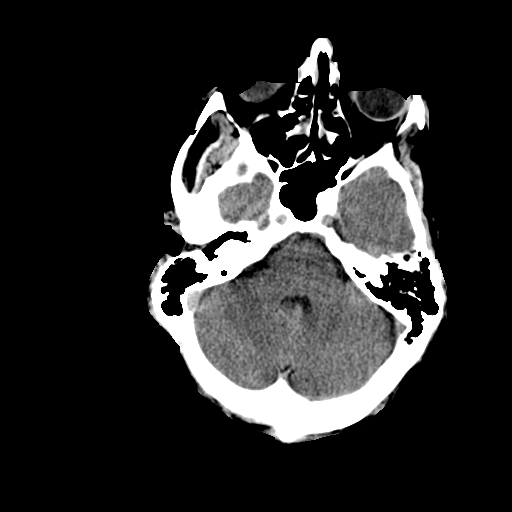
[im 11/36  brain]
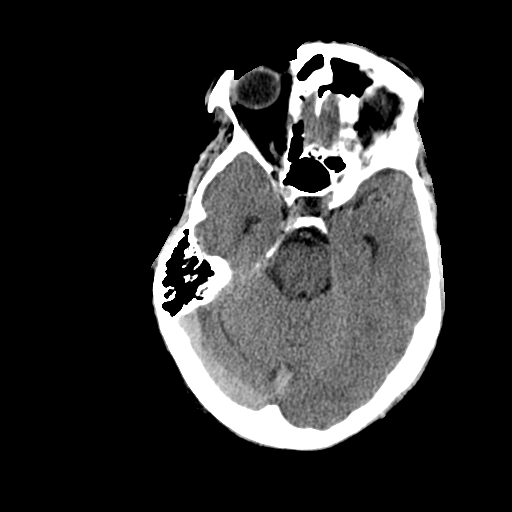
[im 13/36  brain]
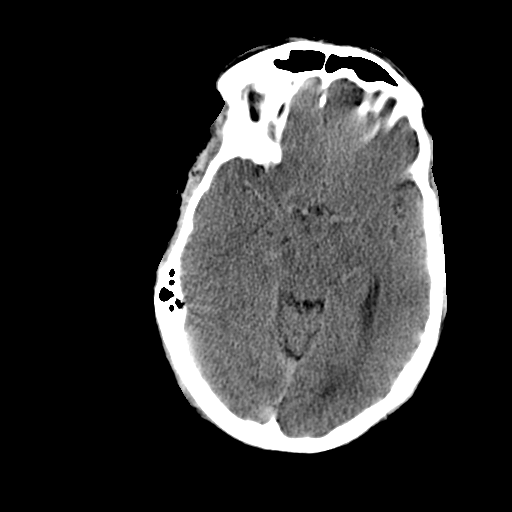
[im 13/36  bone]
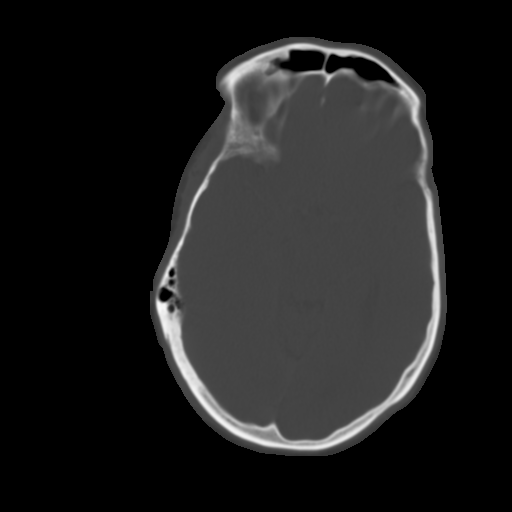
[im 16/36  brain]
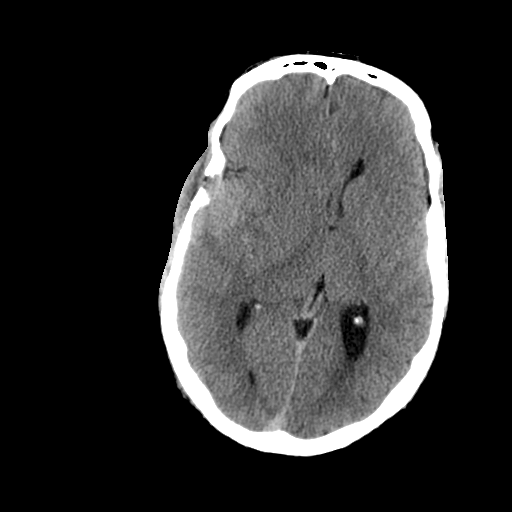
[im 18/36  brain]
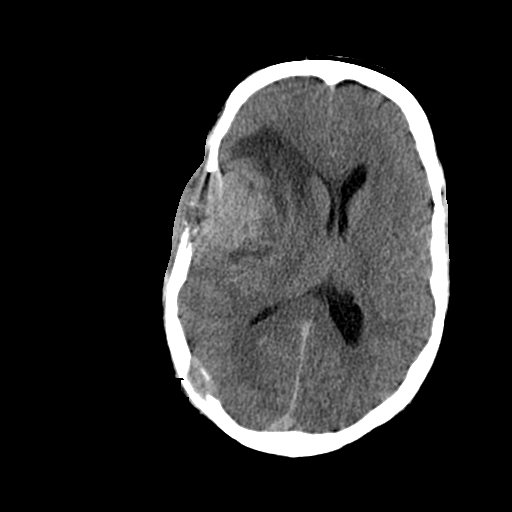
[im 21/36  brain]
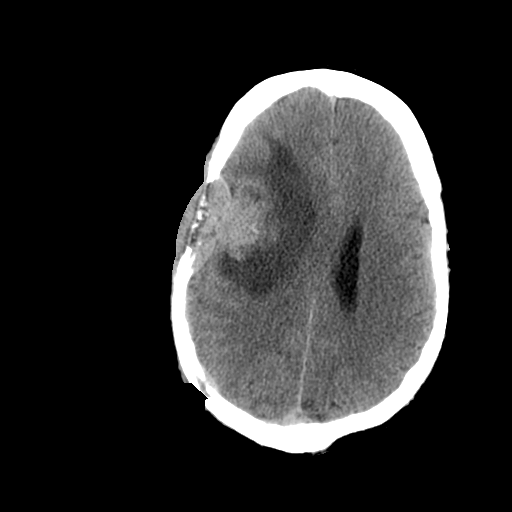
[im 23/36  brain]
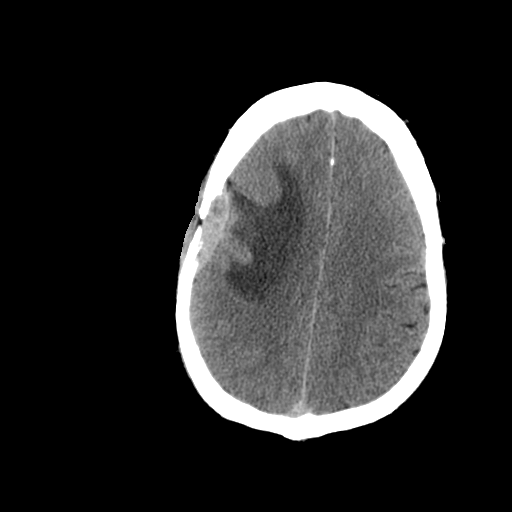
[im 23/36  bone]
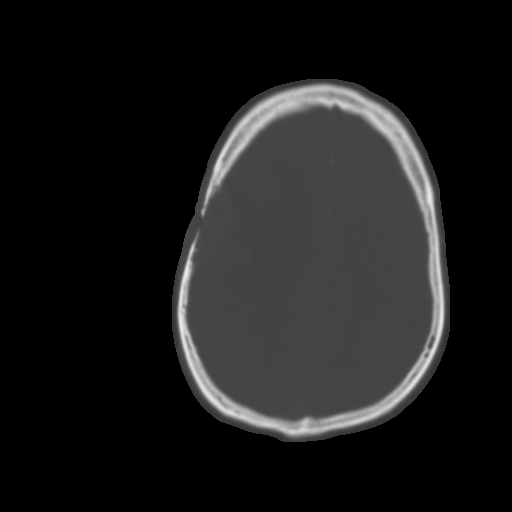
[im 26/36  brain]
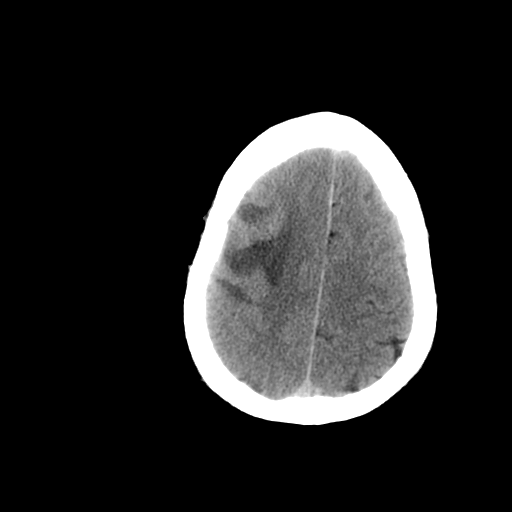
[im 28/36  brain]
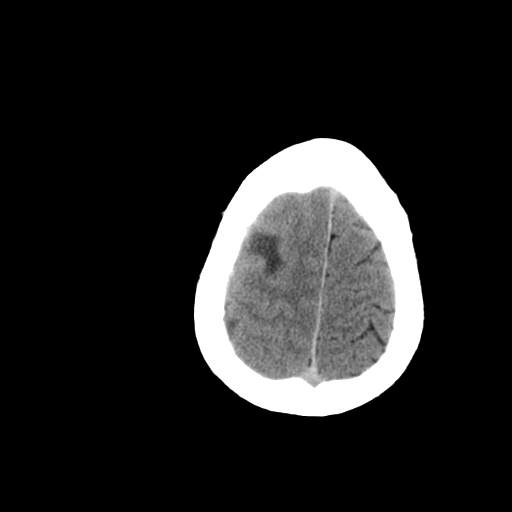
[im 31/36  brain]
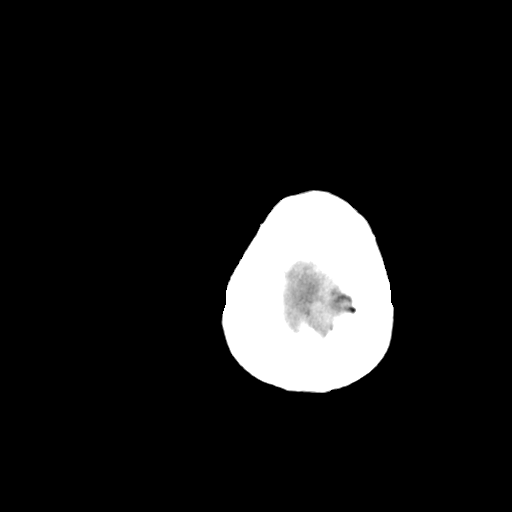
[im 33/36  brain]
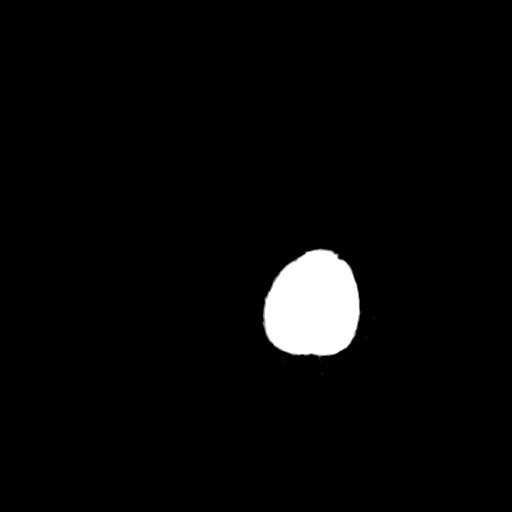
[im 33/36  bone]
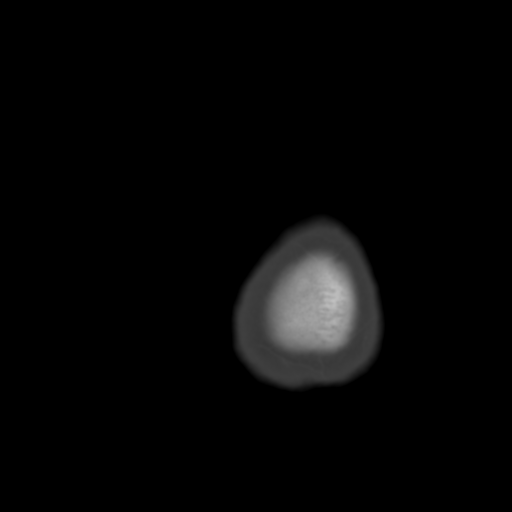

[13 of 30 positions shown; findings below may reference images not displayed]

FINDINGS: A heterogeneously enhancing lytic mass lesion is centered
on the right fronto-temporal skull.  The lesion measures 6.0 x
x 4.0 cm. There is significant intracranial extension with marked
surrounding hypoattenuation compatible with edema.  There is mass
effect with midline shift of 12 mm at the foramen of Zheno.  There
is significant effacement of the right lateral ventricle as well as
the basal cisterns.  There is no hydrocephalus.

A second lytic skull lesion is noted in the right parietal region.
The lesion measures 3.0 x 1.7 cm. Soft tissue enhancement is
associated as well.  No left-sided lytic lesions are present. No
parenchymal mass lesions are identified.

The paranasal sinuses and mastoid air cells are clear.
IMPRESSION: 1.  Right frontal temporal lytic skull lesion with extensive
intracranial soft tissue extension as described.
2.  Marked surrounding vasogenic edema and mass effect with
effacement of the right lateral ventricle and 12 mm midline shift.
3.  The second lytic lesion with soft tissue enhancement is noted
in the right parietal skull.  This raises concern for metastatic
disease primarily.

These results were called by telephone on 06/30/2011  at  [DATE]
p.m. to  Dr. Yury, who verbally acknowledged these results.

## 2012-10-15 IMAGING — CR DG CHEST 2V
2 series · 2 of 2 positions shown · non-contrast
Comparison: Chest CT 06/30/2011 and earlier.

CLINICAL DATA: 50-year-old male preoperative study for craniotomy.
Lung mass, brain masses.

CHEST - 2 VIEW

[view not recorded (1 of 2)]
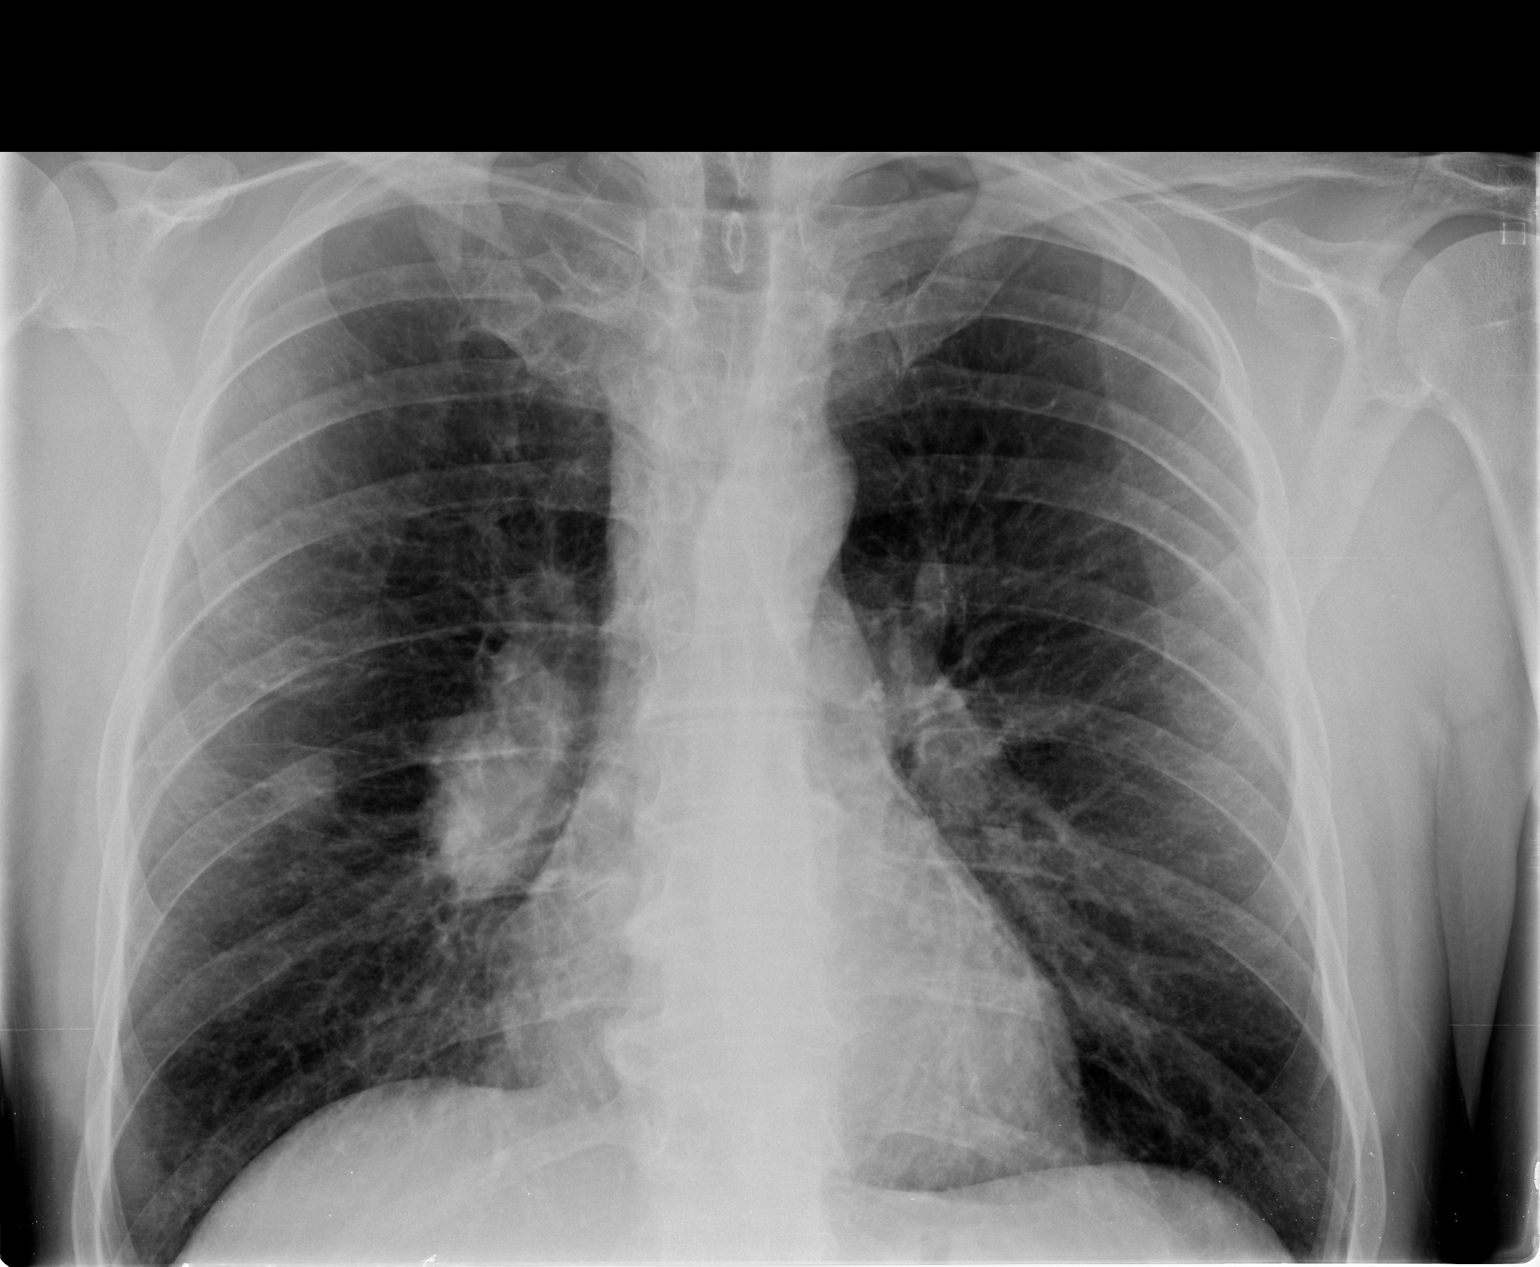

[view not recorded (2 of 2)]
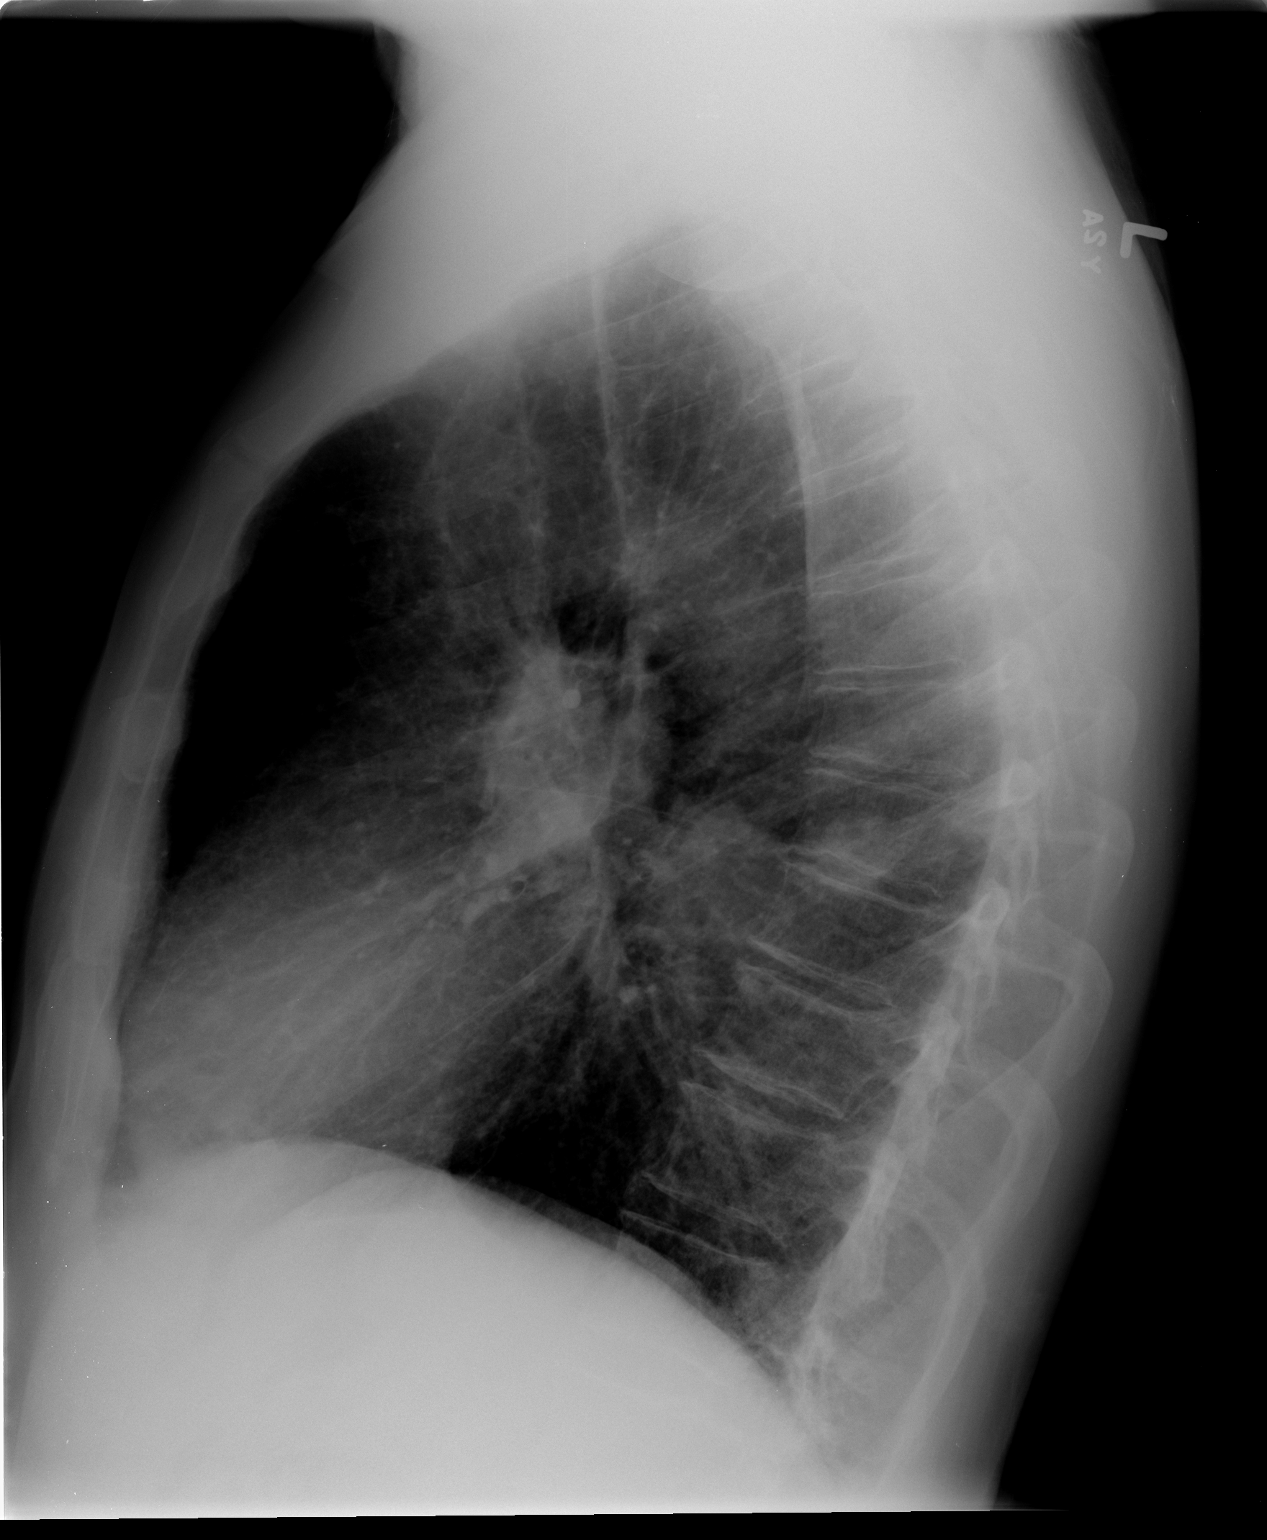

[2 of 2 positions shown; findings below may reference images not displayed]

FINDINGS: Spiculated 23 mm right perihilar lung mass with lobulated
right hilar enlargement.  Chronic increased interstitial markings
and large lung volumes.  No cardiomegaly.  No pneumothorax,
pulmonary edema or pleural effusion.  No thoracic destructive
osseous lesion identified.
IMPRESSION: Stable right lung mass and hilar adenopathy.  Underlying chronic
lung disease.

## 2012-10-21 ENCOUNTER — Other Ambulatory Visit (HOSPITAL_COMMUNITY): Payer: 59

## 2013-01-30 ENCOUNTER — Other Ambulatory Visit (HOSPITAL_COMMUNITY): Payer: Self-pay | Admitting: Oncology

## 2013-02-23 ENCOUNTER — Other Ambulatory Visit (HOSPITAL_COMMUNITY): Payer: Self-pay | Admitting: Oncology
